# Patient Record
Sex: Male | Born: 1950 | Race: White | Hispanic: No | Marital: Married | Smoking: Former smoker
Health system: Southern US, Community
[De-identification: ages and names within clinical notes are randomized; demographics above are authoritative.]

## PROBLEM LIST (undated history)

## (undated) DIAGNOSIS — K219 Gastro-esophageal reflux disease without esophagitis: Secondary | ICD-10-CM

---

## 2007-10-06 ENCOUNTER — Emergency Department (HOSPITAL_COMMUNITY): Admission: EM | Admit: 2007-10-06 | Discharge: 2007-10-06 | Payer: Self-pay | Admitting: Emergency Medicine

## 2013-12-14 DIAGNOSIS — Z86718 Personal history of other venous thrombosis and embolism: Secondary | ICD-10-CM

## 2013-12-14 HISTORY — DX: Personal history of other venous thrombosis and embolism: Z86.718

## 2014-01-10 ENCOUNTER — Other Ambulatory Visit (HOSPITAL_COMMUNITY): Payer: Self-pay | Admitting: Physician Assistant

## 2014-01-10 ENCOUNTER — Ambulatory Visit (HOSPITAL_COMMUNITY)
Admission: RE | Admit: 2014-01-10 | Discharge: 2014-01-10 | Disposition: A | Discharge status: Home / Self Care | Payer: BC Managed Care – PPO | Source: Ambulatory Visit | Attending: Vascular Surgery | Admitting: Vascular Surgery

## 2014-01-10 DIAGNOSIS — M79661 Pain in right lower leg: Secondary | ICD-10-CM

## 2017-07-24 ENCOUNTER — Ambulatory Visit
Admission: RE | Admit: 2017-07-24 | Discharge: 2017-07-24 | Disposition: A | Discharge status: Home / Self Care | Payer: Self-pay | Source: Ambulatory Visit | Attending: Physician Assistant | Admitting: Physician Assistant

## 2017-07-24 ENCOUNTER — Other Ambulatory Visit: Payer: Self-pay | Admitting: Physician Assistant

## 2017-07-24 DIAGNOSIS — W19XXXA Unspecified fall, initial encounter: Secondary | ICD-10-CM

## 2017-08-29 ENCOUNTER — Emergency Department (HOSPITAL_COMMUNITY)
Admission: EM | Admit: 2017-08-29 | Discharge: 2017-08-30 | Disposition: A | Discharge status: Home / Self Care | Payer: BLUE CROSS/BLUE SHIELD | Attending: Emergency Medicine | Admitting: Emergency Medicine

## 2017-08-29 ENCOUNTER — Encounter (HOSPITAL_COMMUNITY): Payer: Self-pay | Admitting: Emergency Medicine

## 2017-08-29 DIAGNOSIS — Z86718 Personal history of other venous thrombosis and embolism: Secondary | ICD-10-CM | POA: Diagnosis not present

## 2017-08-29 DIAGNOSIS — R2241 Localized swelling, mass and lump, right lower limb: Secondary | ICD-10-CM | POA: Diagnosis not present

## 2017-08-29 DIAGNOSIS — M7989 Other specified soft tissue disorders: Secondary | ICD-10-CM

## 2017-08-29 LAB — CBC WITH DIFFERENTIAL/PLATELET
ABS IMMATURE GRANULOCYTES: 0 10*3/uL (ref 0.0–0.1)
BASOS ABS: 0.1 10*3/uL (ref 0.0–0.1)
BASOS PCT: 1 %
Eosinophils Absolute: 0.2 10*3/uL (ref 0.0–0.7)
Eosinophils Relative: 3 %
HCT: 41.8 % (ref 39.0–52.0)
HEMOGLOBIN: 14.2 g/dL (ref 13.0–17.0)
IMMATURE GRANULOCYTES: 0 %
LYMPHS PCT: 32 %
Lymphs Abs: 2.5 10*3/uL (ref 0.7–4.0)
MCH: 33.2 pg (ref 26.0–34.0)
MCHC: 34 g/dL (ref 30.0–36.0)
MCV: 97.7 fL (ref 78.0–100.0)
MONO ABS: 0.9 10*3/uL (ref 0.1–1.0)
Monocytes Relative: 12 %
NEUTROS ABS: 4 10*3/uL (ref 1.7–7.7)
NEUTROS PCT: 52 %
PLATELETS: 142 10*3/uL — AB (ref 150–400)
RBC: 4.28 MIL/uL (ref 4.22–5.81)
RDW: 13.6 % (ref 11.5–15.5)
WBC: 7.7 10*3/uL (ref 4.0–10.5)

## 2017-08-29 LAB — BASIC METABOLIC PANEL
Anion gap: 9 (ref 5–15)
BUN: 17 mg/dL (ref 8–23)
CHLORIDE: 110 mmol/L (ref 98–111)
CO2: 22 mmol/L (ref 22–32)
Calcium: 9 mg/dL (ref 8.9–10.3)
Creatinine, Ser: 1.5 mg/dL — ABNORMAL HIGH (ref 0.61–1.24)
GFR, EST AFRICAN AMERICAN: 54 mL/min — AB (ref >60)
GFR, EST NON AFRICAN AMERICAN: 47 mL/min — AB (ref >60)
Glucose, Bld: 102 mg/dL — ABNORMAL HIGH (ref 70–99)
POTASSIUM: 3.8 mmol/L (ref 3.5–5.1)
SODIUM: 141 mmol/L (ref 135–145)

## 2017-08-29 LAB — D-DIMER, QUANTITATIVE: D-Dimer, Quant: 1.25 ug/mL-FEU — ABNORMAL HIGH (ref 0.00–0.50)

## 2017-08-29 MED ORDER — RIVAROXABAN (XARELTO) VTE STARTER PACK (15 & 20 MG): ORAL_TABLET | ORAL | 0 refills | Status: DC

## 2017-08-29 MED ORDER — ENOXAPARIN SODIUM 120 MG/0.8ML ~~LOC~~ SOLN
115.0000 mg | Freq: Once | SUBCUTANEOUS | Status: AC
  Administered 2017-08-29: 115 mg via SUBCUTANEOUS
  Filled 2017-08-29: qty 0.77

## 2017-08-29 NOTE — Discharge Instructions (Signed)
You have been scheduled to have an ultrasound completed at 8:30 AM tomorrow.  Return for completion of this study.  In the interim, you have been covered with Lovenox which is a blood thinner.  If your ultrasound is positive for a DVT, start Xarelto as prescribed and schedule close follow-up with your primary care doctor.  You do not need to take Xarelto if your ultrasound is negative for a blood clot.  You may return to the emergency department for new or concerning symptoms.

## 2017-08-29 NOTE — ED Triage Notes (Signed)
Patient reports right calf pain with redness/swelling , pt. concerned about DVT due to long flight this week , respirations unlabored /no chest pain .

## 2017-08-29 NOTE — ED Provider Notes (Signed)
[place] EMERGENCY DEPARTMENT Provider Note   CSN: [id number] Arrival date & time: 08/29/17  2001     History   Chief Complaint Chief Complaint  Patient presents with  . ? DVT    Lower leg swelling    HPI Charles King is a 67 y.o. male.  67 year old male presents to the emergency department for evaluation of right lower extremity swelling and redness.  Symptoms have been present for the past 3 days.  Gradually worsening.  Notes some tenderness to his medial right calf at the site of redness.  Has noted some mild associated swelling in his right ankle.  Expresses concern for DVT due to long flight this week.  Has a history of DVT x3 as well as PE x1.  Is not chronically anticoagulated stating, "my doctor said the cure is worse than the disease"; usually has been on Xarelto with prior VTE events.  Denies any chest pain, shortness of breath, lightheadedness, syncope.  No recent fevers.  Was sent from urgent care for ultrasound completion.     History reviewed. No pertinent past medical history.  There are no active problems to display for this patient.   History reviewed. No pertinent surgical history.      Home Medications    Prior to Admission medications   Medication Sig Start Date End Date Taking? Authorizing Provider  Rivaroxaban 15 & 20 MG TBPK Take as directed on package: Start with one 15mg  tablet by mouth twice a day with food. On Day 22, switch to one 20mg  tablet once a day with food. 08/29/17   Antonietta Breach, PA-C    Family History No family history on file.  Social History Social History   Tobacco Use  . Smoking status: Never Smoker  . Smokeless tobacco: Never Used  Substance Use Topics  . Alcohol use: Yes  . Drug use: Never     Allergies   Patient has no known allergies.   Review of Systems Review of Systems Ten systems reviewed and are negative for acute change, except as noted in the HPI.    Physical Exam Updated Vital  Signs BP 124/90   Pulse (!) 50   Temp 98.7 F (37.1 C) (Oral)   Resp 16   Ht 6' (1.829 m)   Wt 115.7 kg   SpO2 100%   BMI 34.58 kg/m   Physical Exam  Constitutional: He is oriented to person, place, and time. He appears well-developed and well-nourished. No distress.  Nontoxic appearing and in no distress  HENT:  Head: Normocephalic and atraumatic.  Eyes: Conjunctivae and EOM are normal. No scleral icterus.  Neck: Normal range of motion.  Cardiovascular: Normal rate, regular rhythm and intact distal pulses.  DP pulse 2+ in the right lower extremity.  Pulmonary/Chest: Effort normal. No stridor. No respiratory distress.  Respirations even and unlabored  Musculoskeletal: Normal range of motion.  Area of erythema to the medial aspect of the right calf.  There is mild induration without lymphangitic streaking.  Associated tenderness to palpation.  No heat to touch.  Neurological: He is alert and oriented to person, place, and time. He exhibits normal muscle tone. Coordination normal.  Skin: Skin is warm and dry. No rash noted. He is not diaphoretic. No erythema. No pallor.  Psychiatric: He has a normal mood and affect. His behavior is normal.  Nursing note and vitals reviewed.    ED Treatments / Results  Labs (all labs ordered are listed, but only abnormal  results are displayed) Labs Reviewed  CBC WITH DIFFERENTIAL/PLATELET - Abnormal; Notable for the following components:      Result Value   Platelets 142 (*)    All other components within normal limits  BASIC METABOLIC PANEL - Abnormal; Notable for the following components:   Glucose, Bld 102 (*)    Creatinine, Ser 1.50 (*)    GFR calc non Af Amer 47 (*)    GFR calc Af Amer 54 (*)    All other components within normal limits  D-DIMER, QUANTITATIVE (NOT AT [facility]) - Abnormal; Notable for the following components:   D-Dimer, Quant 1.25 (*)    All other components within normal limits    EKG None  Radiology No results  found.  Procedures Procedures (including critical care time)  Medications Ordered in ED Medications  enoxaparin (LOVENOX) injection 115 mg (has no administration in time range)     Initial Impression / Assessment and Plan / ED Course  I have reviewed the triage vital signs and the nursing notes.  Pertinent labs & imaging results that were available during my care of the patient were reviewed by me and considered in my medical decision making (see chart for details).     67 year old male with prior history of VTE presents to the ED for 3 days of progressively worsening right lower extremity swelling.  Physical exam concerning for DVT.  He is neurovascularly intact with palpable DP pulse.  Unable to complete ultrasound given the hour.  He was given a dose of Lovenox prior to discharge with instruction to return in the morning for lower extremity ultrasound.  Prescription for Xarelto provided should ultrasound be positive for DVT.  Return precautions discussed and provided. Patient discharged in stable condition with no unaddressed concerns.   Final Clinical Impressions(s) / ED Diagnoses   Final diagnoses:  Swelling of right lower extremity  History of DVT (deep vein thrombosis)    ED Discharge Orders         Ordered    Rivaroxaban 15 & 20 MG TBPK     08/29/17 2318    LE VENOUS     08/29/17 2319           Antonietta Breach, PA-C 08/29/17 2340    Tegeler, Gwenyth Allegra, MD 08/30/17 [phone]

## 2017-08-30 ENCOUNTER — Ambulatory Visit (HOSPITAL_COMMUNITY)
Admission: RE | Admit: 2017-08-30 | Discharge: 2017-08-30 | Disposition: A | Discharge status: Home / Self Care | Payer: BLUE CROSS/BLUE SHIELD | Source: Ambulatory Visit | Attending: Emergency Medicine | Admitting: Emergency Medicine

## 2017-08-30 DIAGNOSIS — M7989 Other specified soft tissue disorders: Secondary | ICD-10-CM | POA: Insufficient documentation

## 2017-08-30 DIAGNOSIS — I82401 Acute embolism and thrombosis of unspecified deep veins of right lower extremity: Secondary | ICD-10-CM | POA: Diagnosis not present

## 2017-08-30 NOTE — ED Notes (Signed)
Patient verbalizes understanding of discharge instructions. Opportunity for questioning and answers were provided. Armband removed by staff, pt discharged from ED ambulatory.   

## 2017-08-30 NOTE — Progress Notes (Signed)
VASCULAR LAB PRELIMINARY  PRELIMINARY  PRELIMINARY  PRELIMINARY  Right lower extremity venous duplex completed.    Preliminary report:  There is no DVT noted in the right lower extremity.  There is superficial thrombosis noted in the right GSV in the mid calf at site of knotting.  Artyom Stencel, RVT 08/30/2017, 9:15 AM

## 2018-12-04 ENCOUNTER — Other Ambulatory Visit: Payer: Self-pay

## 2018-12-04 DIAGNOSIS — Z20822 Contact with and (suspected) exposure to covid-19: Secondary | ICD-10-CM

## 2018-12-07 LAB — NOVEL CORONAVIRUS, NAA: SARS-CoV-2, NAA: NOT DETECTED

## 2018-12-11 ENCOUNTER — Emergency Department (HOSPITAL_COMMUNITY): Payer: BC Managed Care – PPO

## 2018-12-11 ENCOUNTER — Emergency Department (HOSPITAL_COMMUNITY)
Admission: EM | Admit: 2018-12-11 | Discharge: 2018-12-11 | Disposition: A | Discharge status: Home / Self Care | Payer: BC Managed Care – PPO | Attending: Emergency Medicine | Admitting: Emergency Medicine

## 2018-12-11 ENCOUNTER — Encounter (HOSPITAL_COMMUNITY): Payer: Self-pay | Admitting: Emergency Medicine

## 2018-12-11 ENCOUNTER — Other Ambulatory Visit: Payer: Self-pay

## 2018-12-11 DIAGNOSIS — Z20828 Contact with and (suspected) exposure to other viral communicable diseases: Secondary | ICD-10-CM | POA: Diagnosis not present

## 2018-12-11 DIAGNOSIS — R058 Other specified cough: Secondary | ICD-10-CM

## 2018-12-11 DIAGNOSIS — R05 Cough: Secondary | ICD-10-CM | POA: Diagnosis present

## 2018-12-11 LAB — COMPREHENSIVE METABOLIC PANEL
ALT: 19 U/L (ref 0–44)
AST: 25 U/L (ref 15–41)
Albumin: 3.2 g/dL — ABNORMAL LOW (ref 3.5–5.0)
Alkaline Phosphatase: 45 U/L (ref 38–126)
Anion gap: 10 (ref 5–15)
BUN: 13 mg/dL (ref 8–23)
CO2: 23 mmol/L (ref 22–32)
Calcium: 8.2 mg/dL — ABNORMAL LOW (ref 8.9–10.3)
Chloride: 107 mmol/L (ref 98–111)
Creatinine, Ser: 1.14 mg/dL (ref 0.61–1.24)
GFR calc Af Amer: >60 mL/min (ref >60)
GFR calc non Af Amer: >60 mL/min (ref >60)
Glucose, Bld: 111 mg/dL — ABNORMAL HIGH (ref 70–99)
Potassium: 3.6 mmol/L (ref 3.5–5.1)
Sodium: 140 mmol/L (ref 135–145)
Total Bilirubin: 0.8 mg/dL (ref 0.3–1.2)
Total Protein: 6.4 g/dL — ABNORMAL LOW (ref 6.5–8.1)

## 2018-12-11 LAB — CBC WITH DIFFERENTIAL/PLATELET
Abs Immature Granulocytes: 0.03 10*3/uL (ref 0.00–0.07)
Basophils Absolute: 0 10*3/uL (ref 0.0–0.1)
Basophils Relative: 0 %
Eosinophils Absolute: 0 10*3/uL (ref 0.0–0.5)
Eosinophils Relative: 0 %
HCT: 41 % (ref 39.0–52.0)
Hemoglobin: 13.9 g/dL (ref 13.0–17.0)
Immature Granulocytes: 1 %
Lymphocytes Relative: 17 %
Lymphs Abs: 0.8 10*3/uL (ref 0.7–4.0)
MCH: 33.3 pg (ref 26.0–34.0)
MCHC: 33.9 g/dL (ref 30.0–36.0)
MCV: 98.1 fL (ref 80.0–100.0)
Monocytes Absolute: 0.6 10*3/uL (ref 0.1–1.0)
Monocytes Relative: 13 %
Neutro Abs: 3.5 10*3/uL (ref 1.7–7.7)
Neutrophils Relative %: 69 %
Platelets: 122 10*3/uL — ABNORMAL LOW (ref 150–400)
RBC: 4.18 MIL/uL — ABNORMAL LOW (ref 4.22–5.81)
RDW: 13.4 % (ref 11.5–15.5)
WBC: 5 10*3/uL (ref 4.0–10.5)
nRBC: 0 % (ref 0.0–0.2)

## 2018-12-11 LAB — SARS CORONAVIRUS 2 (TAT 6-24 HRS): SARS Coronavirus 2: NEGATIVE

## 2018-12-11 MED ORDER — DEXAMETHASONE SODIUM PHOSPHATE 10 MG/ML IJ SOLN
10.0000 mg | Freq: Once | INTRAMUSCULAR | Status: AC
  Administered 2018-12-11: 16:00:00 10 mg via INTRAVENOUS
  Filled 2018-12-11: qty 1

## 2018-12-11 MED ORDER — AZITHROMYCIN 250 MG PO TABS
500.0000 mg | ORAL_TABLET | Freq: Once | ORAL | Status: AC
  Administered 2018-12-11: 20:00:00 500 mg via ORAL
  Filled 2018-12-11: qty 2

## 2018-12-11 MED ORDER — AZITHROMYCIN 250 MG PO TABS: 250.0000 mg | ORAL_TABLET | Freq: Every day | ORAL | 0 refills | Status: DC

## 2018-12-11 MED ORDER — ACETAMINOPHEN 325 MG PO TABS
650.0000 mg | ORAL_TABLET | Freq: Once | ORAL | Status: AC
  Administered 2018-12-11: 650 mg via ORAL
  Filled 2018-12-11: qty 2

## 2018-12-11 MED ORDER — SODIUM CHLORIDE 0.9 % IV BOLUS
1000.0000 mL | Freq: Once | INTRAVENOUS | Status: AC
  Administered 2018-12-11: 1000 mL via INTRAVENOUS

## 2018-12-11 NOTE — ED Provider Notes (Signed)
[place] EMERGENCY DEPARTMENT Provider Note   CSN: [id number] Arrival date & time: 12/11/18  1423     History   Chief Complaint Chief Complaint  Patient presents with  . Covid Sx    HPI Charles King is a 68 y.o. male otherwise healthy presents to emergency department today with chief complaint of Covid-like symptoms x1 week.  He is endorsing generalized weakness, dry cough, shortness of breath and fever.  He states 3 coworkers tested positive for Covid last week.  He also had a test x 1 week ago that was negative.  States his symptoms really started 2 days after his testing. He took tylenol with transient symptom relief.  Reports subjective fever and chills at home however does not have a thermometer to measure his temperature.  He denies any congestion, sore throat, chest pain, abdominal pain, nausea, vomiting, urinary symptoms, diarrhea.  History provided by patient with additional history obtained from chart review.    History reviewed. No pertinent past medical history.  There are no active problems to display for this patient.   History reviewed. No pertinent surgical history.      Home Medications    Prior to Admission medications   Medication Sig Start Date End Date Taking? Authorizing Provider  azithromycin (ZITHROMAX) 250 MG tablet Take 1 tablet (250 mg total) by mouth daily for 4 days. Take one pill daily 12/11/18 12/15/18  Anapaola Kinsel, Harley Hallmark, PA-C  Rivaroxaban 15 & 20 MG TBPK Take as directed on package: Start with one 15mg  tablet by mouth twice a day with food. On Day 22, switch to one 20mg  tablet once a day with food. 08/29/17   Antonietta Breach, PA-C    Family History History reviewed. No pertinent family history.  Social History Social History   Tobacco Use  . Smoking status: Never Smoker  . Smokeless tobacco: Never Used  Substance Use Topics  . Alcohol use: Yes  . Drug use: Never     Allergies   Patient has no known allergies.    Review of Systems Review of Systems All other systems are reviewed and are negative for acute change except as noted in the HPI.   Physical Exam Updated Vital Signs BP 116/69   Pulse 75   Temp (!) 100.4 F (38 C) (Oral)   Resp 18   Ht 6' (1.829 m)   Wt 106.6 kg   SpO2 97%   BMI 31.87 kg/m   Physical Exam Vitals signs and nursing note reviewed.  Constitutional:      General: He is not in acute distress.    Appearance: He is not ill-appearing.  HENT:     Head: Normocephalic and atraumatic.     Right Ear: Tympanic membrane and external ear normal.     Left Ear: Tympanic membrane and external ear normal.     Nose: Nose normal.     Mouth/Throat:     Mouth: Mucous membranes are moist.     Pharynx: Oropharynx is clear.  Eyes:     General: No scleral icterus.       Right eye: No discharge.        Left eye: No discharge.     Extraocular Movements: Extraocular movements intact.     Conjunctiva/sclera: Conjunctivae normal.     Pupils: Pupils are equal, round, and reactive to light.  Neck:     Musculoskeletal: Normal range of motion.     Vascular: No JVD.  Cardiovascular:  Rate and Rhythm: Normal rate and regular rhythm.     Pulses: Normal pulses.          Radial pulses are 2+ on the right side and 2+ on the left side.     Heart sounds: Normal heart sounds.  Pulmonary:     Comments: Lungs clear to auscultation in all fields. Symmetric chest rise. No wheezing, rales, or rhonchi. Abdominal:     Comments: Abdomen is soft, non-distended, and non-tender in all quadrants. No rigidity, no guarding. No peritoneal signs.  Musculoskeletal: Normal range of motion.  Skin:    General: Skin is warm and dry.     Capillary Refill: Capillary refill takes less than 2 seconds.  Neurological:     Mental Status: He is oriented to person, place, and time.     GCS: GCS eye subscore is 4. GCS verbal subscore is 5. GCS motor subscore is 6.     Comments: Fluent speech, no facial droop.   Psychiatric:        Behavior: Behavior normal.      ED Treatments / Results  Labs (all labs ordered are listed, but only abnormal results are displayed) Labs Reviewed  COMPREHENSIVE METABOLIC PANEL - Abnormal; Notable for the following components:      Result Value   Glucose, Bld 111 (*)    Calcium 8.2 (*)    Total Protein 6.4 (*)    Albumin 3.2 (*)    All other components within normal limits  CBC WITH DIFFERENTIAL/PLATELET - Abnormal; Notable for the following components:   RBC 4.18 (*)    Platelets 122 (*)    All other components within normal limits  SARS CORONAVIRUS 2 (TAT 6-24 HRS)    EKG None  Radiology Dg Chest Portable 1 View  Result Date: 12/11/2018 CLINICAL DATA:  Generalized weakness, dry cough, shortness of breath, fever, worse with 3 people who tested positive for COVID last week but his test this past Monday was negative EXAM: PORTABLE CHEST 1 VIEW COMPARISON:  Portable exam 1539 hours without priors for comparison FINDINGS: Upper normal heart size. Mediastinal contours and pulmonary vascularity normal. Question mild patchy infiltrate LEFT mid lung. Remaining lungs clear. No pleural effusion or pneumothorax. No acute osseous findings. IMPRESSION: Question mild patchy infiltrate at LEFT mid lung. Electronically Signed   By: Lavonia Dana M.D.   On: 12/11/2018 16:05    Procedures Procedures (including critical care time)  Medications Ordered in ED Medications  azithromycin (ZITHROMAX) tablet 500 mg (has no administration in time range)  sodium chloride 0.9 % bolus 1,000 mL (0 mLs Intravenous Stopped 12/11/18 1754)  acetaminophen (TYLENOL) tablet 650 mg (650 mg Oral Given 12/11/18 1604)  dexamethasone (DECADRON) injection 10 mg (10 mg Intravenous Given 12/11/18 1605)     Initial Impression / Assessment and Plan / ED Course  I have reviewed the triage vital signs and the nursing notes.  Pertinent labs & imaging results that were available during my care of  the patient were reviewed by me and considered in my medical decision making (see chart for details).  Patient seen and examined. Patient nontoxic appearing, in no apparent distress.  Febrile to 100.4.  No hypoxia or tachycardia.He is coughing through entirety of exam.  Lungs are clear to auscultation in all fields.  No wheezing rhonchi or rales heard.  Abdomen is nontender, no peritoneal signs.  Chest xray viewed by me shows patchy infiltrate in left midlung. CBC with no leukocytosis, no anemia.  Does have  thrombocytopenia with platelet count of 122.  Based on chart review he has history of the same. IVF, Tylenol, Decadron given. Patient ambulated without respiratory distress, SpO2 ranged from 93-95%, did drop to 91% briefly but immediately improved to 93% on room air. Covid swab pending.  Patient aware he needs to self quarantine and self isolate until he has results.  On reassessment pt is afebrile at 98.84F oral.  Discussed symptomatic treatment.  Engaged in shared decision making, patient feels that he can manage his symptoms at home.  Will prescribe a Z-Pak as his chest x-ray showed patchy infiltrate to suggest pneumonia versus possible Covid. The patient appears reasonably screened and/or stabilized for discharge and I doubt any other medical condition or other [facility] requiring further screening, evaluation, or treatment in the ED at this time prior to discharge. The patient is safe for discharge with strict return precautions discussed. Recommend pcp follow up if symptoms persist. Findings and plan of care discussed with supervising physician Dr. Royann Shivers.   Portions of this note were generated with [employer]. Dictation errors may occur despite best attempts at proofreading.    Final Clinical Impressions(s) / ED Diagnoses   Final diagnoses:  Cough with exposure to COVID-19 virus    ED Discharge Orders         Ordered    azithromycin (ZITHROMAX) 250 MG tablet  Daily      12/11/18 1912           Flint Melter 12/11/18 1916    Lucrezia Starch, MD 12/12/18 [phone]

## 2018-12-11 NOTE — ED Triage Notes (Signed)
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 Pt got tested, came back negative on Monday. Pt endorses generalized weakness, dry cough, sob, fever. Denies GI sx. Took tylenol at home with no improvement.

## 2018-12-11 NOTE — Discharge Instructions (Addendum)
Thank you for allowing [place] to care for you today.   Please return to the emergency department if you have any new or worsening symptoms.  Your Covid test today is still pending.  The results to be available in 24 hours.  Please continue to self quarantine until you have the result.  You will be called if the test is positive.  If it is negative it will result and be visible for you to see in your MyChart.  -Prescription sent to your pharmacy for azithromycin.  Your chest x-ray showed pneumonia which very well could be Covid.  Just in case we will cover you with antibiotic.  You were given your first dose today in the emergency department.  Please start taking tomorrow and take as directed.  Medications- You can take medications to help treat your symptoms: -Tylenol for fever and body aches. Please take as prescribed on the bottle. -Over the coutner cough medicine such as mucinex, robitussin, or other brands.  Treatment- This is a virus and unfortunately there are no antibitotics approved to treat this virus at this time. It is important to monitor your symptoms closely: -You should have a theremometer at home to check your temperature when feeling feverish. -Use a pulse ox meter to measure your oxygen when feeling short of breath.  -If your fever is over 100.4 despite taking tylenol or if your oxygen level drops below 94% these are reasons to rturn to the emergency department for further evaluation. Please call the emergency department before you come to make [place] aware.    We recommend you self-isolate for 10 days and to inform your work/family/friends that you has the virus.  They will need to self-quarantine for 14 days to monitor for symptoms.    Again: symptoms of shortnessf breath, chest pain, difficulty breathing, new onset of confuison, any symptoms that are concerning. And you or the person should come to emergency department for evaluation.   I hope you feel better soon

## 2018-12-11 NOTE — ED Notes (Signed)
Pt ambulated around the room. Oxygen saturation ranged from 93% - 95%. It did drop to 91% briefly but went back up to 93% immediately.

## 2018-12-13 ENCOUNTER — Other Ambulatory Visit: Payer: Self-pay

## 2018-12-13 ENCOUNTER — Encounter (HOSPITAL_COMMUNITY): Payer: Self-pay | Admitting: Emergency Medicine

## 2018-12-13 ENCOUNTER — Inpatient Hospital Stay (HOSPITAL_COMMUNITY)
Admission: EM | Admit: 2018-12-13 | Discharge: 2018-12-20 | DRG: 177 | Disposition: A | Discharge status: Home / Self Care | Payer: BC Managed Care – PPO | Attending: Internal Medicine | Admitting: Internal Medicine

## 2018-12-13 ENCOUNTER — Emergency Department (HOSPITAL_COMMUNITY): Payer: BC Managed Care – PPO

## 2018-12-13 DIAGNOSIS — Z23 Encounter for immunization: Secondary | ICD-10-CM

## 2018-12-13 DIAGNOSIS — J9601 Acute respiratory failure with hypoxia: Secondary | ICD-10-CM | POA: Insufficient documentation

## 2018-12-13 DIAGNOSIS — Z87891 Personal history of nicotine dependence: Secondary | ICD-10-CM | POA: Diagnosis not present

## 2018-12-13 DIAGNOSIS — E876 Hypokalemia: Secondary | ICD-10-CM | POA: Diagnosis present

## 2018-12-13 DIAGNOSIS — K219 Gastro-esophageal reflux disease without esophagitis: Secondary | ICD-10-CM | POA: Diagnosis present

## 2018-12-13 DIAGNOSIS — Z86718 Personal history of other venous thrombosis and embolism: Secondary | ICD-10-CM

## 2018-12-13 DIAGNOSIS — J1289 Other viral pneumonia: Secondary | ICD-10-CM | POA: Diagnosis present

## 2018-12-13 DIAGNOSIS — U071 COVID-19: Secondary | ICD-10-CM | POA: Diagnosis not present

## 2018-12-13 HISTORY — DX: Gastro-esophageal reflux disease without esophagitis: K21.9

## 2018-12-13 LAB — COMPREHENSIVE METABOLIC PANEL
ALT: 30 U/L (ref 0–44)
AST: 37 U/L (ref 15–41)
Albumin: 3 g/dL — ABNORMAL LOW (ref 3.5–5.0)
Alkaline Phosphatase: 54 U/L (ref 38–126)
Anion gap: 12 (ref 5–15)
BUN: 18 mg/dL (ref 8–23)
CO2: 22 mmol/L (ref 22–32)
Calcium: 8.5 mg/dL — ABNORMAL LOW (ref 8.9–10.3)
Chloride: 106 mmol/L (ref 98–111)
Creatinine, Ser: 1.16 mg/dL (ref 0.61–1.24)
GFR calc Af Amer: >60 mL/min (ref >60)
GFR calc non Af Amer: >60 mL/min (ref >60)
Glucose, Bld: 111 mg/dL — ABNORMAL HIGH (ref 70–99)
Potassium: 3.1 mmol/L — ABNORMAL LOW (ref 3.5–5.1)
Sodium: 140 mmol/L (ref 135–145)
Total Bilirubin: 1 mg/dL (ref 0.3–1.2)
Total Protein: 6.7 g/dL (ref 6.5–8.1)

## 2018-12-13 LAB — CBC WITH DIFFERENTIAL/PLATELET
Abs Immature Granulocytes: 0.07 10*3/uL (ref 0.00–0.07)
Basophils Absolute: 0 10*3/uL (ref 0.0–0.1)
Basophils Relative: 0 %
Eosinophils Absolute: 0 10*3/uL (ref 0.0–0.5)
Eosinophils Relative: 0 %
HCT: 41 % (ref 39.0–52.0)
Hemoglobin: 13.9 g/dL (ref 13.0–17.0)
Immature Granulocytes: 1 %
Lymphocytes Relative: 8 %
Lymphs Abs: 0.9 10*3/uL (ref 0.7–4.0)
MCH: 33.7 pg (ref 26.0–34.0)
MCHC: 33.9 g/dL (ref 30.0–36.0)
MCV: 99.3 fL (ref 80.0–100.0)
Monocytes Absolute: 0.5 10*3/uL (ref 0.1–1.0)
Monocytes Relative: 4 %
Neutro Abs: 10.1 10*3/uL — ABNORMAL HIGH (ref 1.7–7.7)
Neutrophils Relative %: 87 %
Platelets: 180 10*3/uL (ref 150–400)
RBC: 4.13 MIL/uL — ABNORMAL LOW (ref 4.22–5.81)
RDW: 13.7 % (ref 11.5–15.5)
WBC: 11.6 10*3/uL — ABNORMAL HIGH (ref 4.0–10.5)
nRBC: 0 % (ref 0.0–0.2)

## 2018-12-13 LAB — TRIGLYCERIDES: Triglycerides: 99 mg/dL (ref <150)

## 2018-12-13 LAB — PROTIME-INR
INR: 1 (ref 0.8–1.2)
Prothrombin Time: 13.5 seconds (ref 11.4–15.2)

## 2018-12-13 LAB — MAGNESIUM: Magnesium: 1.9 mg/dL (ref 1.7–2.4)

## 2018-12-13 LAB — LACTIC ACID, PLASMA
Lactic Acid, Venous: 1.8 mmol/L (ref 0.5–1.9)
Lactic Acid, Venous: 1.7 mmol/L (ref 0.5–1.9)

## 2018-12-13 LAB — FERRITIN: Ferritin: 393 ng/mL — ABNORMAL HIGH (ref 24–336)

## 2018-12-13 LAB — POC SARS CORONAVIRUS 2 AG -  ED: SARS Coronavirus 2 Ag: POSITIVE — AB

## 2018-12-13 LAB — LACTATE DEHYDROGENASE: LDH: 295 U/L — ABNORMAL HIGH (ref 98–192)

## 2018-12-13 LAB — FIBRINOGEN: Fibrinogen: >800 mg/dL — ABNORMAL HIGH (ref 210–475)

## 2018-12-13 LAB — C-REACTIVE PROTEIN: CRP: 20.6 mg/dL — ABNORMAL HIGH (ref <1.0)

## 2018-12-13 LAB — APTT: aPTT: 32 seconds (ref 24–36)

## 2018-12-13 LAB — PROCALCITONIN: Procalcitonin: 0.43 ng/mL

## 2018-12-13 LAB — D-DIMER, QUANTITATIVE: D-Dimer, Quant: 2.32 ug/mL-FEU — ABNORMAL HIGH (ref 0.00–0.50)

## 2018-12-13 MED ORDER — ZINC SULFATE 220 (50 ZN) MG PO CAPS
220.0000 mg | ORAL_CAPSULE | Freq: Every day | ORAL | Status: DC
  Administered 2018-12-13 – 2018-12-20 (×8): 220 mg via ORAL
  Filled 2018-12-13 (×9): qty 1

## 2018-12-13 MED ORDER — PANTOPRAZOLE SODIUM 40 MG PO TBEC
40.0000 mg | DELAYED_RELEASE_TABLET | Freq: Every day | ORAL | Status: DC
  Administered 2018-12-14 – 2018-12-20 (×7): 40 mg via ORAL
  Filled 2018-12-13 (×8): qty 1

## 2018-12-13 MED ORDER — SODIUM CHLORIDE 0.9 % IV SOLN: 100.0000 mg | INTRAVENOUS | Status: DC

## 2018-12-13 MED ORDER — ALBUTEROL SULFATE HFA 108 (90 BASE) MCG/ACT IN AERS: 4.0000 | INHALATION_SPRAY | Freq: Once | RESPIRATORY_TRACT | Status: DC

## 2018-12-13 MED ORDER — METOCLOPRAMIDE HCL 5 MG/ML IJ SOLN
10.0000 mg | Freq: Once | INTRAMUSCULAR | Status: AC
  Administered 2018-12-13: 10 mg via INTRAVENOUS
  Filled 2018-12-13: qty 2

## 2018-12-13 MED ORDER — SODIUM CHLORIDE 0.9 % IV SOLN
1.0000 g | Freq: Once | INTRAVENOUS | Status: AC
  Administered 2018-12-13: 1 g via INTRAVENOUS
  Filled 2018-12-13: qty 10

## 2018-12-13 MED ORDER — HYDROCOD POLST-CPM POLST ER 10-8 MG/5ML PO SUER
5.0000 mL | Freq: Two times a day (BID) | ORAL | Status: DC | PRN
  Administered 2018-12-15: 5 mL via ORAL
  Filled 2018-12-13: qty 5

## 2018-12-13 MED ORDER — SODIUM CHLORIDE 0.9 % IV SOLN
200.0000 mg | Freq: Once | INTRAVENOUS | Status: AC
  Administered 2018-12-13: 200 mg via INTRAVENOUS
  Filled 2018-12-13 (×2): qty 40

## 2018-12-13 MED ORDER — SODIUM CHLORIDE 0.9% FLUSH: 3.0000 mL | Freq: Once | INTRAVENOUS | Status: DC

## 2018-12-13 MED ORDER — ACETAMINOPHEN 500 MG PO TABS
1000.0000 mg | ORAL_TABLET | Freq: Once | ORAL | Status: AC
  Administered 2018-12-13: 1000 mg via ORAL
  Filled 2018-12-13: qty 2

## 2018-12-13 MED ORDER — ACETAMINOPHEN 325 MG PO TABS
650.0000 mg | ORAL_TABLET | Freq: Four times a day (QID) | ORAL | Status: DC | PRN
  Administered 2018-12-13 – 2018-12-16 (×3): 650 mg via ORAL
  Filled 2018-12-13 (×3): qty 2

## 2018-12-13 MED ORDER — GUAIFENESIN-DM 100-10 MG/5ML PO SYRP
10.0000 mL | ORAL_SOLUTION | ORAL | Status: DC | PRN
  Administered 2018-12-13: 10 mL via ORAL
  Filled 2018-12-13: qty 10

## 2018-12-13 MED ORDER — VITAMIN C 500 MG PO TABS
500.0000 mg | ORAL_TABLET | Freq: Every day | ORAL | Status: DC
  Administered 2018-12-13 – 2018-12-20 (×8): 500 mg via ORAL
  Filled 2018-12-13 (×8): qty 1

## 2018-12-13 MED ORDER — SODIUM CHLORIDE 0.9 % IV BOLUS
500.0000 mL | Freq: Once | INTRAVENOUS | Status: AC
  Administered 2018-12-13: 500 mL via INTRAVENOUS

## 2018-12-13 MED ORDER — METHYLPREDNISOLONE SODIUM SUCC 125 MG IJ SOLR
0.5000 mg/kg | Freq: Two times a day (BID) | INTRAMUSCULAR | Status: DC
  Administered 2018-12-13 – 2018-12-14 (×2): 57.5 mg via INTRAVENOUS
  Filled 2018-12-13 (×2): qty 2

## 2018-12-13 MED ORDER — ONDANSETRON HCL 4 MG PO TABS: 4.0000 mg | ORAL_TABLET | Freq: Four times a day (QID) | ORAL | Status: DC | PRN

## 2018-12-13 MED ORDER — POTASSIUM CHLORIDE CRYS ER 20 MEQ PO TBCR
40.0000 meq | EXTENDED_RELEASE_TABLET | Freq: Once | ORAL | Status: AC
  Administered 2018-12-13: 40 meq via ORAL
  Filled 2018-12-13: qty 2

## 2018-12-13 MED ORDER — ALBUTEROL SULFATE HFA 108 (90 BASE) MCG/ACT IN AERS
2.0000 | INHALATION_SPRAY | Freq: Four times a day (QID) | RESPIRATORY_TRACT | Status: DC
  Administered 2018-12-13 – 2018-12-20 (×23): 2 via RESPIRATORY_TRACT
  Filled 2018-12-13 (×3): qty 6.7

## 2018-12-13 MED ORDER — ENOXAPARIN SODIUM 40 MG/0.4ML ~~LOC~~ SOLN
40.0000 mg | SUBCUTANEOUS | Status: DC
  Administered 2018-12-13: 40 mg via SUBCUTANEOUS
  Filled 2018-12-13: qty 0.4

## 2018-12-13 MED ORDER — ONDANSETRON HCL 4 MG/2ML IJ SOLN: 4.0000 mg | Freq: Four times a day (QID) | INTRAMUSCULAR | Status: DC | PRN

## 2018-12-13 MED ORDER — METHYLPREDNISOLONE SODIUM SUCC 125 MG IJ SOLR: 125.0000 mg | Freq: Once | INTRAMUSCULAR | Status: DC

## 2018-12-13 MED ORDER — AZITHROMYCIN 250 MG PO TABS: 500.0000 mg | ORAL_TABLET | Freq: Once | ORAL | Status: DC

## 2018-12-13 NOTE — H&P (Signed)
History and Physical    Rio Kidane WCH:[id number] DOB: June 17, 1950 DOA: 12/13/2018  PCP: Alroy Dust, L.Marlou Sa, MD  Patient coming from: Home  I have personally briefly reviewed patient's old medical records in [place]  Chief Complaint: Shortness of breath, cough, fevers  HPI: Charles King is a 68 y.o. male with medical history significant for GERD and history of DVT s/p anticoagulation treatment with Xarelto who presents to the ED for evaluation of persistent and progressive shortness of breath, nonproductive cough, fevers, diaphoresis, myalgias, and generalized weakness.  Patient states symptoms began about 1 week ago.  He was seen in the ED on 12/11/2018 at which time he had a negative SARS-CoV-2, NAA test.  Follow-up chest x-ray showed a questionable mild patchy infiltrate at the left midlung.  He was treated with IV Decadron 10 mg in the ED and discharged with a course of oral azithromycin.  Patient returns to the ED for evaluation of persistent and worsening symptoms.  He denies any associated nausea, vomiting, chest pain, abdominal pain, diarrhea, dysuria, peripheral edema.  The only medication he is regularly taking his Nexium for acid reflux.  He is a former smoker of 2 packs/day for 15 years, reports quitting about 35 years ago.  He reports drinking 1 beer occasionally.  He reports occasional marijuana use, denies any IV drug or cocaine use.  He says one of his brothers died of a heart attack 3 years ago.  He says another one of his brothers had a partial lung resection due to cancer.  ED Course:  Initial vitals showed BP 99/63, pulse 94, RR 14, temp 100.0 Fahrenheit, SPO2 93% on 2 L supplemental O2 via .  Per ED documentation SPO2 drops to 80% on room air with slight ambulation.  Labs are notable for WBC 11.6, hemoglobin 13.9, platelets 180,000, potassium 3.1, sodium 140, bicarb 22, BUN 18, creatinine 1.16, AST 37, ALT 30, alk phos 54, total bilirubin 1.0.  Lactic acid 1.8.   Blood cultures were obtained and pending.  SARS-CoV-2 antigen test is positive.  CRP is 20.6, ferritin 293, LDH 295, procalcitonin 0.43, triglycerides 99.  Portable chest x-ray showed bilateral peripheral and subpleural hazy densities, left greater than right with overall progression of airspace densities compared to prior on 12/11/2018.  Patient was given IV ceftriaxone, 500 mL normal saline, oral potassium 40 mEq, and Tylenol.  The hospitalist service was consulted admit for further evaluation and management.  Review of Systems: All systems reviewed and are negative except as documented in history of present illness above.   Past Medical History:  Diagnosis Date  . GERD (gastroesophageal reflux disease)   . History of DVT (deep vein thrombosis) 12/2013    History reviewed. No pertinent surgical history.  Social History:  reports that he has quit smoking. His smoking use included cigarettes. He has a 30.00 pack-year smoking history. He has never used smokeless tobacco. He reports current alcohol use. He reports previous drug use.  No Known Allergies  Family History  Problem Relation Age of Onset  . Heart attack Brother      Prior to Admission medications   Medication Sig Start Date End Date Taking? Authorizing Provider  acetaminophen (TYLENOL) 325 MG tablet Take 650 mg by mouth every 6 (six) hours as needed.   Yes [provider]  esomeprazole (NEXIUM) 20 MG capsule Take 20 mg by mouth daily at 12 noon.   Yes [provider]  Rivaroxaban 15 & 20 MG TBPK Take as directed on  package: Start with one 62m tablet by mouth twice a day with food. On Day 22, switch to one 229mtablet once a day with food. Patient not taking: Reported on 12/13/2018 08/29/17   HuAntonietta BreachPA-C    Physical Exam: Vitals:   12/13/18 1845 12/13/18 1900 12/13/18 1915 12/13/18 1930  BP: 121/70 106/66  113/71  Pulse: 80 75 77 75  Resp: (!) 25 (!) 26 (!) 23 (!) 23  Temp:      TempSrc:       SpO2: 94% 95% 97% 98%  Weight:      Height:        Constitutional: Resting supine in bed, NAD, calm, comfortable Eyes: PERRL, lids and conjunctivae normal ENMT: Mucous membranes are moist. Posterior pharynx clear of any exudate or lesions.Normal dentition.  Neck: normal, supple, no masses. Respiratory: Faint inspiratory crackles bilateral lung fields.  Normal respiratory effort. No accessory muscle use.  Cardiovascular: Regular rate and rhythm, no murmurs / rubs / gallops. No extremity edema. 2+ pedal pulses. Abdomen: no tenderness, no masses palpated. No hepatosplenomegaly. Bowel sounds positive.  Musculoskeletal: no clubbing / cyanosis. No joint deformity upper and lower extremities. Good ROM, no contractures. Normal muscle tone.  Skin: no rashes, lesions, ulcers. No induration Neurologic: CN 2-12 grossly intact. Sensation intact, Strength 5/5 in all 4.  Psychiatric: Normal judgment and insight. Alert and oriented x 3. Normal mood.     Labs on Admission: I have personally reviewed following labs and imaging studies  CBC: Recent Labs  Lab 12/11/18 1606 12/13/18 1433  WBC 5.0 11.6*  NEUTROABS 3.5 10.1*  HGB 13.9 13.9  HCT 41.0 41.0  MCV 98.1 99.3  PLT 122* 18825 Basic Metabolic Panel: Recent Labs  Lab 12/11/18 1606 12/13/18 1433 12/13/18 1733  NA 140 140  --   K 3.6 3.1*  --   CL 107 106  --   CO2 23 22  --   GLUCOSE 111* 111*  --   BUN 13 18  --   CREATININE 1.14 1.16  --   CALCIUM 8.2* 8.5*  --   MG  --   --  1.9   GFR: Estimated Creatinine Clearance: 80.8 mL/min (by C-G formula based on SCr of 1.16 mg/dL). Liver Function Tests: Recent Labs  Lab 12/11/18 1606 12/13/18 1433  AST 25 37  ALT 19 30  ALKPHOS 45 54  BILITOT 0.8 1.0  PROT 6.4* 6.7  ALBUMIN 3.2* 3.0*   No results for input(s): LIPASE, AMYLASE in the last 168 hours. No results for input(s): AMMONIA in the last 168 hours. Coagulation Profile: Recent Labs  Lab 12/13/18 1753  INR 1.0    Cardiac Enzymes: No results for input(s): CKTOTAL, CKMB, CKMBINDEX, TROPONINI in the last 168 hours. BNP (last 3 results) No results for input(s): PROBNP in the last 8760 hours. HbA1C: No results for input(s): HGBA1C in the last 72 hours. CBG: No results for input(s): GLUCAP in the last 168 hours. Lipid Profile: Recent Labs    12/13/18 1753  TRIG 99   Thyroid Function Tests: No results for input(s): TSH, T4TOTAL, FREET4, T3FREE, THYROIDAB in the last 72 hours. Anemia Panel: Recent Labs    12/13/18 1753  FERRITIN 393*   Urine analysis: No results found for: COLORURINE, APPEARANCEUR, LABSPEC, PHURINE, GLUCOSEU, HGBUR, BILIRUBINUR, KETONESUR, PROTEINUR, UROBILINOGEN, NITRITE, LEUKOCYTESUR  Radiological Exams on Admission: Dg Chest Portable 1 View  Result Date: 12/13/2018 CLINICAL DATA:  [age]ear old male with generalized weakness and dry cough. COVID  19 exposure. EXAM: PORTABLE CHEST 1 VIEW COMPARISON:  Chest radiograph dated 12/11/2018. FINDINGS: There is shallow inspiration. Bilateral predominantly peripheral and subpleural hazy densities, left greater right most consistent with developing infiltrate, likely atypical or viral in etiology. Overall progression of airspace densities compared to the prior radiograph. There is no pleural effusion or pneumothorax. Stable cardiac silhouette. No acute osseous pathology. IMPRESSION: Bilateral peripheral and subpleural hazy densities most consistent with developing infiltrate, likely atypical or viral in etiology. Follow-up recommended. Electronically Signed   By: Anner Crete M.D.   On: 12/13/2018 15:08    EKG: Independently reviewed. Normal sinus rhythm, LAD, low voltage, motion artifact.  No prior for comparison.  Assessment/Plan Principal Problem:   Acute hypoxemic respiratory failure due to COVID-19 [facility]) Active Problems:   Hypokalemia   GERD (gastroesophageal reflux disease)  Blade Scheff is a 67 y.o. male with medical  history significant for GERD and history of DVT s/p anticoagulation treatment with Xarelto who is admitted with acute respiratory failure with hypoxia due to COVID-19 viral pneumonia.  Acute respiratory failure with hypoxia due to COVID-19 viral pneumonia: -SARS-CoV-2 antigen positive test on 12/13/2018.   -He becomes hypoxic to low 80s SPO2 on room air with minimal ambulation, improves to mid to high 90s on 2 L supplemental O2 via [place].  -Chest x-ray shows bilateral peripheral subpleural densities, progressed from prior x-ray 2 days ago.  -Initial inflammatory markers show CRP 20.6, ferritin 293, LDH 295 -Start IV Solu-Medrol 0.5 mg/kg every 12 hours -Start IV remdesivir per pharmacy protocol -Continue supplemental oxygen and wean off as able -Continue albuterol inhaler, flutter valve, incentive spirometer -Continue antitussives, vitamin C, zinc  Hypokalemia: Repleted orally.  Magnesium is 1.9.  Continue to monitor.  GERD: Continue PPI.  DVT prophylaxis: Lovenox Code Status: Full code, confirmed with patient Family Communication: Discussed with patient, he has discussed with family Disposition Plan: Admit to [facility] called: None Admission status: Inpatient for management of acute respiratory failure with hypoxia due to COVID-19 viral pneumonia, anticipate at least 5-day inpatient stay for treatment with IV steroids and IV remdesivir.   Zada Finders MD [facility]  If 7PM-7AM, please contact night-coverage [email or web address]  12/13/2018, 7:56 PM

## 2018-12-13 NOTE — ED Notes (Signed)
Taking tylenol every 4 hours, last dose at 1130.  Continues to have non-productive cough, on abx currently.  Seen here 2 days ago.

## 2018-12-13 NOTE — ED Notes (Signed)
POC CoV-2 COVID test Positive, called to [facility]

## 2018-12-13 NOTE — ED Notes (Signed)
Report has been called to [facility]. Orlie Pollen, RN received report.

## 2018-12-13 NOTE — ED Provider Notes (Signed)
[facility] EMERGENCY DEPARTMENT Provider Note   CSN: [id number] Arrival date & time: 12/13/18  1349     History   Chief Complaint Chief Complaint  Patient presents with   Shortness of Breath    HPI Charles King is a 68 y.o. male sees PCP with [facility]. No medical hx besides GERD      HPI  Presents for persistent shortness of breath, dry cough, body aches, fever. Patient states he started having symptoms 1 week ago with become persistently worse.  Was seen in ED 2 days ago with possible infiltrate on chest x-ray treated with azithromycin will dose of 10 mg of Decadron.  Patient states that he went home and continue take Tylenol 500 mg every 4 hours which did not control his fevers or body aches.  States that T-max was 103.  States he came in today because he was feeling increased shortness of breath worse with exertion with no chest pain.  Does endorse chest pressure.  Also endorses headache that is bandlike, came on gradually, without neurologic symptoms as weakness, vision changes, numbness.   States he occasionally has periods of sweatiness especially at night.  States he has had to prop himself on 2 pillows at night.  Denies any swelling in his legs.  Denies any abdominal swelling.  States his appetite is decreased but denies any changes in taste or smell.  Denies any nausea, vomiting, diarrhea, states last bowel movement was yesterday was normal.  Denies any congestion.  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  States that he was on approximately past he states he last took it approximately 8 years ago when he had his last DVT.  States his primary care doctor took him off anticoagulation due to concerns of side effects of the medication.  States that his symptoms today feel nothing like his prior pulmonary embolism.  States his last dose of Tylenol was earlier this morning approximately 8 AM.  States that he took his azithromycin this  morning as well.  History reviewed. No pertinent past medical history.  There are no active problems to display for this patient.   History reviewed. No pertinent surgical history.      Home Medications    Prior to Admission medications   Medication Sig Start Date End Date Taking? Authorizing Provider  acetaminophen (TYLENOL) 325 MG tablet Take 650 mg by mouth every 6 (six) hours as needed.   Yes [provider]  esomeprazole (NEXIUM) 20 MG capsule Take 20 mg by mouth daily at 12 noon.   Yes [provider]  Rivaroxaban 15 & 20 MG TBPK Take as directed on package: Start with one 15mg  tablet by mouth twice a day with food. On Day 22, switch to one 20mg  tablet once a day with food. Patient not taking: Reported on 12/13/2018 08/29/17   Antonietta Breach, PA-C    Family History No family history on file.  Social History Social History   Tobacco Use   Smoking status: Never Smoker   Smokeless tobacco: Never Used  Substance Use Topics   Alcohol use: Yes   Drug use: Never     Allergies   Patient has no known allergies.   Review of Systems Review of Systems  Constitutional: Positive for activity change, appetite change, chills, fatigue and fever.  HENT: Negative for congestion.   Respiratory: Positive for cough and shortness of breath. Negative for choking and chest tightness.   Cardiovascular: Negative  for chest pain, palpitations and leg swelling.  Gastrointestinal: Negative for abdominal distention, abdominal pain, constipation, diarrhea, nausea and vomiting.  Neurological: Negative for dizziness.  All other systems reviewed and are negative.    Physical Exam Updated Vital Signs BP 134/81    Pulse 82    Temp 99.7 F (37.6 C) (Oral)    Resp (!) 22    Ht 6' (1.829 m)    Wt 114.8 kg    SpO2 96%    BMI 34.31 kg/m   Physical Exam Vitals signs and nursing note reviewed.  Constitutional:      General: He is not in acute distress. HENT:     Head:  Normocephalic and atraumatic.     Nose: Nose normal.     Mouth/Throat:     Mouth: Mucous membranes are moist.  Eyes:     General: No scleral icterus. Neck:     Musculoskeletal: Normal range of motion.  Cardiovascular:     Rate and Rhythm: Normal rate and regular rhythm.     Pulses: Normal pulses.     Heart sounds: Normal heart sounds.  Pulmonary:     Effort: Pulmonary effort is normal.     Comments: Crackles diffusely worst in right lower lung base.  Able speak in full sentences.  No accessory muscle usage. Abdominal:     Palpations: Abdomen is soft.     Tenderness: There is no abdominal tenderness.     Comments: Abdomen nontender, nondistended, no fluid wave,  Musculoskeletal:     Right lower leg: No edema.     Left lower leg: No edema.     Comments: No swelling or tenderness bilateral lower extremities.  Skin:    General: Skin is warm and dry.     Capillary Refill: Capillary refill takes less than 2 seconds.  Neurological:     Mental Status: He is alert. Mental status is at baseline.  Psychiatric:        Mood and Affect: Mood normal.        Behavior: Behavior normal.      ED Treatments / Results  Labs (all labs ordered are listed, but only abnormal results are displayed) Labs Reviewed  COMPREHENSIVE METABOLIC PANEL - Abnormal; Notable for the following components:      Result Value   Potassium 3.1 (*)    Glucose, Bld 111 (*)    Calcium 8.5 (*)    Albumin 3.0 (*)    All other components within normal limits  CBC WITH DIFFERENTIAL/PLATELET - Abnormal; Notable for the following components:   WBC 11.6 (*)    RBC 4.13 (*)    Neutro Abs 10.1 (*)    All other components within normal limits  POC SARS CORONAVIRUS 2 AG -  ED    EKG None  Radiology Dg Chest Portable 1 View  Result Date: 12/13/2018 CLINICAL DATA:  68 year old male with generalized weakness and dry cough. COVID 19 exposure. EXAM: PORTABLE CHEST 1 VIEW COMPARISON:  Chest radiograph dated 12/11/2018.  FINDINGS: There is shallow inspiration. Bilateral predominantly peripheral and subpleural hazy densities, left greater right most consistent with developing infiltrate, likely atypical or viral in etiology. Overall progression of airspace densities compared to the prior radiograph. There is no pleural effusion or pneumothorax. Stable cardiac silhouette. No acute osseous pathology. IMPRESSION: Bilateral peripheral and subpleural hazy densities most consistent with developing infiltrate, likely atypical or viral in etiology. Follow-up recommended. Electronically Signed   By: Anner Crete M.D.   On: 12/13/2018  15:08    Procedures Procedures (including critical care time)  Medications Ordered in ED Medications  sodium chloride flush (NS) 0.9 % injection 3 mL (has no administration in time range)  acetaminophen (TYLENOL) tablet 1,000 mg (has no administration in time range)  metoCLOPramide (REGLAN) injection 10 mg (has no administration in time range)  potassium chloride SA (KLOR-CON) CR tablet 40 mEq (has no administration in time range)  cefTRIAXone (ROCEPHIN) 1 g in sodium chloride 0.9 % 100 mL IVPB (has no administration in time range)     Initial Impression / Assessment and Plan / ED Course  I have reviewed the triage vital signs and the nursing notes.  Pertinent labs & imaging results that were available during my care of the patient were reviewed by me and considered in my medical decision making (see chart for details).        68 year old male with no pertinent medical history presented today for worsening cough, fatigue, fever, shortness of breath for the past week.  Was seen 2 days ago and treated for Pneumonia with azithromycin which she has taken as directed without improvement in symptoms.  T-max at home of 103.  Patient given Tylenol, Reglan, potassium 40 mEq p.o.  Will assess CBC, CMP, chest x-ray, EKG and ambulate patient on pulse ox.  Patient low risk by Wells criteria  for pulmonary embolism.  Symptoms are more consistent with viral illness such as COVID-19.  Patient denies any chest pain or pleuritic discomfort.  States this feels nothing like prior PE.   On my personal review of the chest x-ray there appears to be a worsening left lower lobe infiltrate.  Radiological read indicates viral infiltrate.   We will treat with 1 dose of ceftriaxone and azithromycin.  Rapid Covid test positive.  Patient desaturates to 80% and is dizzy on ambulation.  Currently is on 4 L nasal cannula satting 95%.  Desats to 88% at rest without oxygen.  Patient continues to be tachypnic. Mild decrease in BP--at rest patient is asymptomatic. Will give 500 mL NS.   Patient appears to have new oxygen requirement secondary to Covid pneumonia.  Discussed with patient that he most likely needs hospital admission will be transferred to [place] hospitalist for admission Dr. Posey Pronto will admit.  7:15 PM    Charles King was evaluated in Emergency Department on 12/13/2018 for the symptoms described in the history of present illness. He was evaluated in the context of the global COVID-19 pandemic, which necessitated consideration that the patient might be at risk for infection with the SARS-CoV-2 virus that causes COVID-19. Institutional protocols and algorithms that pertain to the evaluation of patients at risk for COVID-19 are in a state of rapid change based on information released by regulatory bodies including the CDC and federal and state organizations. These policies and algorithms were followed during the patient's care in the ED.   Final Clinical Impressions(s) / ED Diagnoses   Final diagnoses:  None    ED Discharge Orders    None       Tedd Sias, [place] 12/13/18 1919    Blanchie Dessert, MD 12/15/18 2138

## 2018-12-13 NOTE — ED Notes (Signed)
Pt intermittently coughing with no acute resp distress.   Remains talkative and able to speak full sentences.   No current needs at this time.

## 2018-12-13 NOTE — ED Notes (Signed)
RA sats with slight ambulation drop to 80 with significant dizziness noted.

## 2018-12-13 NOTE — ED Notes (Signed)
[facility] called to transport pt to [facility]

## 2018-12-13 NOTE — ED Notes (Signed)
[facility] also called to transport pt to [facility]

## 2018-12-13 NOTE — ED Triage Notes (Signed)
Pt was exposed to covid last week, but his test was negative. Pt has generalized weakness, dry cough, shortness of breath and fevers. Taking tylenol with no relief.

## 2018-12-14 DIAGNOSIS — K219 Gastro-esophageal reflux disease without esophagitis: Secondary | ICD-10-CM

## 2018-12-14 LAB — CBC WITH DIFFERENTIAL/PLATELET
Abs Immature Granulocytes: 0.08 K/uL — ABNORMAL HIGH (ref 0.00–0.07)
Basophils Absolute: 0 K/uL (ref 0.0–0.1)
Basophils Relative: 0 %
Eosinophils Absolute: 0 K/uL (ref 0.0–0.5)
Eosinophils Relative: 0 %
HCT: 38 % — ABNORMAL LOW (ref 39.0–52.0)
Hemoglobin: 13 g/dL (ref 13.0–17.0)
Immature Granulocytes: 1 %
Lymphocytes Relative: 5 %
Lymphs Abs: 0.4 K/uL — ABNORMAL LOW (ref 0.7–4.0)
MCH: 33.7 pg (ref 26.0–34.0)
MCHC: 34.2 g/dL (ref 30.0–36.0)
MCV: 98.4 fL (ref 80.0–100.0)
Monocytes Absolute: 0.3 K/uL (ref 0.1–1.0)
Monocytes Relative: 3 %
Neutro Abs: 8.7 K/uL — ABNORMAL HIGH (ref 1.7–7.7)
Neutrophils Relative %: 91 %
Platelets: 178 K/uL (ref 150–400)
RBC: 3.86 MIL/uL — ABNORMAL LOW (ref 4.22–5.81)
RDW: 13.8 % (ref 11.5–15.5)
WBC: 9.5 K/uL (ref 4.0–10.5)
nRBC: 0 % (ref 0.0–0.2)

## 2018-12-14 LAB — COMPREHENSIVE METABOLIC PANEL
ALT: 28 U/L (ref 0–44)
AST: 33 U/L (ref 15–41)
Albumin: 3 g/dL — ABNORMAL LOW (ref 3.5–5.0)
Alkaline Phosphatase: 50 U/L (ref 38–126)
Anion gap: 10 (ref 5–15)
BUN: 17 mg/dL (ref 8–23)
CO2: 19 mmol/L — ABNORMAL LOW (ref 22–32)
Calcium: 8 mg/dL — ABNORMAL LOW (ref 8.9–10.3)
Chloride: 110 mmol/L (ref 98–111)
Creatinine, Ser: 1.02 mg/dL (ref 0.61–1.24)
GFR calc Af Amer: >60 mL/min (ref >60)
GFR calc non Af Amer: >60 mL/min (ref >60)
Glucose, Bld: 177 mg/dL — ABNORMAL HIGH (ref 70–99)
Potassium: 3.8 mmol/L (ref 3.5–5.1)
Sodium: 139 mmol/L (ref 135–145)
Total Bilirubin: 0.9 mg/dL (ref 0.3–1.2)
Total Protein: 6.5 g/dL (ref 6.5–8.1)

## 2018-12-14 LAB — D-DIMER, QUANTITATIVE: D-Dimer, Quant: 2 ug{FEU}/mL — ABNORMAL HIGH (ref 0.00–0.50)

## 2018-12-14 LAB — PHOSPHORUS: Phosphorus: 1.8 mg/dL — ABNORMAL LOW (ref 2.5–4.6)

## 2018-12-14 LAB — ABO/RH: ABO/RH(D): O NEG

## 2018-12-14 LAB — C-REACTIVE PROTEIN: CRP: 21.5 mg/dL — ABNORMAL HIGH (ref <1.0)

## 2018-12-14 LAB — HIV ANTIBODY (ROUTINE TESTING W REFLEX): HIV Screen 4th Generation wRfx: NONREACTIVE

## 2018-12-14 LAB — FERRITIN: Ferritin: 379 ng/mL — ABNORMAL HIGH (ref 24–336)

## 2018-12-14 LAB — MAGNESIUM: Magnesium: 2 mg/dL (ref 1.7–2.4)

## 2018-12-14 MED ORDER — METHYLPREDNISOLONE SODIUM SUCC 125 MG IJ SOLR
60.0000 mg | Freq: Two times a day (BID) | INTRAMUSCULAR | Status: DC
  Administered 2018-12-14 – 2018-12-15 (×3): 60 mg via INTRAVENOUS
  Filled 2018-12-14 (×2): qty 2
  Filled 2018-12-14: qty 0.96

## 2018-12-14 MED ORDER — SODIUM CHLORIDE 0.9 % IV SOLN
100.0000 mg | INTRAVENOUS | Status: AC
  Administered 2018-12-15 – 2018-12-17 (×3): 100 mg via INTRAVENOUS
  Filled 2018-12-14 (×3): qty 100

## 2018-12-14 MED ORDER — PNEUMOCOCCAL VAC POLYVALENT 25 MCG/0.5ML IJ INJ
0.5000 mL | INJECTION | INTRAMUSCULAR | Status: AC
  Administered 2018-12-20: 0.5 mL via INTRAMUSCULAR
  Filled 2018-12-14 (×2): qty 0.5

## 2018-12-14 MED ORDER — ENOXAPARIN SODIUM 60 MG/0.6ML ~~LOC~~ SOLN: 60.0000 mg | SUBCUTANEOUS | Status: DC

## 2018-12-14 MED ORDER — SODIUM CHLORIDE 0.9 % IV SOLN
100.0000 mg | INTRAVENOUS | Status: AC
  Administered 2018-12-14: 100 mg via INTRAVENOUS
  Filled 2018-12-14: qty 100

## 2018-12-14 MED ORDER — ORAL CARE MOUTH RINSE
15.0000 mL | Freq: Two times a day (BID) | OROMUCOSAL | Status: DC
  Administered 2018-12-14 – 2018-12-20 (×13): 15 mL via OROMUCOSAL

## 2018-12-14 MED ORDER — SODIUM CHLORIDE 0.9% IV SOLUTION
Freq: Once | INTRAVENOUS | Status: AC
  Administered 2018-12-14: 18:00:00 via INTRAVENOUS

## 2018-12-14 MED ORDER — ENOXAPARIN SODIUM 60 MG/0.6ML ~~LOC~~ SOLN
0.5000 mg/kg | Freq: Two times a day (BID) | SUBCUTANEOUS | Status: DC
  Administered 2018-12-14 – 2018-12-18 (×8): 55 mg via SUBCUTANEOUS
  Filled 2018-12-14 (×8): qty 0.6

## 2018-12-14 MED ORDER — TOCILIZUMAB 400 MG/20ML IV SOLN
800.0000 mg | Freq: Once | INTRAVENOUS | Status: AC
  Administered 2018-12-14: 12:00:00 800 mg via INTRAVENOUS
  Filled 2018-12-14: qty 40

## 2018-12-14 NOTE — Progress Notes (Signed)
PROGRESS NOTE  Charles King  [id number] DOB: 1950-05-27 DOA: 12/13/2018 PCP: Alroy Dust, L.Marlou Sa, MD   Brief Narrative: Charles King is a 68 y.o. male with a history of DVT and GERD who presented to the ED 11/29 with progressive shortness of breath, dry cough, and fevers. He was evaluated 11/27 for similar complaints with negative covid testing, given decadron x1 in ED and discharged on azithromycin. On repeat evaluation he was hypoxic, SARS-CoV-2 testing was positive, and CXR demonstrated interval development of bilateral peripheral and subpleural hazy densities. Remdesivir and steroids were started, and patient admitted to [facility]. Inflammatory markers were grossly elevated and rising with progressive hypoxia on 11/30 prompting administration of convalescent plasma and tocilizumab.  Assessment & Plan: Principal Problem:   Acute hypoxemic respiratory failure due to COVID-19 [facility]) Active Problems:   Hypokalemia   GERD (gastroesophageal reflux disease)  Acute hypoxic respiratory failure due to covid-19 pneumonia:  - Continue remdesivir x5 days - Continue steroids - Discussed EUA, rationale, known and potential risks as well as suspected but unproven benefits of convalescent plasma. Fact sheet provided and all questions answered and patient wishes to proceed. This will be administered.  - Continue airborne, contact precautions. PPE including surgical gown, gloves, cap, shoe covers, and CAPR used during this encounter in a negative pressure room.  - Check daily labs: CBC w/diff, CMP, d-dimer, ferritin, CRP - Maintain euvolemia/net negative.  - Avoid NSAIDs - Recommend proning and aggressive use of incentive spirometry. - Will give tocilizumab 800mg  IV x1. The patient is covid positive with progressive hypoxia and CRP >20x ULN. He denies immunocompromise, has no symptoms of diverticulitis/perforation. Platelets 178, ANC 8.7k, AST/ALT wnl and no knownTB or hepatitis B infection. The off-label  nature of this medication, as well as both positive and negative studies available to date were discussed. Patient wishes to proceed. Will monitor LFTs and for infusion reaction. - Goals of care were discussed. Prognosis is guarded.   History of DVT: This, in combination with severe covid, increase risk of thrombosis while admitted.  - Give intermediate dose lovenox (0.5mg /kg q12h)   DVT prophylaxis: Lovenox Code Status: Full Family Communication: None at bedside, pt to relay POC Disposition Plan: Uncertain  Consultants:   None  Procedures:   None  Antimicrobials:  Remdesivir 11/29 - 12/3   Subjective: Shortness of breath slightly worse today, no chest pain. Oxygen needs increased to 6L over past 24 hours. No leg swelling.  Objective: Vitals:   12/14/18 0000 12/14/18 0800 12/14/18 0905 12/14/18 0931  BP: 120/70   115/73  Pulse: 76   80  Resp: 18     Temp: 98.6 F (37 C)   99 F (37.2 C)  TempSrc: Oral   Oral  SpO2: 96% 93% (!) 87% 91%  Weight:      Height:        Intake/Output Summary (Last 24 hours) at 12/14/2018 1306 Last data filed at 12/14/2018 0700 Gross per 24 hour  Intake 100 ml  Output 800 ml  Net -700 ml   Filed Weights   12/13/18 1425  Weight: 114.8 kg    Gen: 68 y.o. male in no distress  Pulm: Non-labored tachypnea with 6LPM O2. Crackles bilaterally.  CV: Regular rate and rhythm. No murmur, rub, or gallop. No JVD, no pedal edema. GI: Abdomen soft, non-tender, non-distended, with normoactive bowel sounds. No organomegaly or masses felt. Ext: Warm, no deformities Skin: No rashes, lesions or ulcers Neuro: Alert and oriented. No focal neurological deficits. Psych: Judgement and  insight appear normal. Mood & affect appropriate.   Data Reviewed: I have personally reviewed following labs and imaging studies  CBC: Recent Labs  Lab 12/11/18 1606 12/13/18 1433 12/14/18 0212  WBC 5.0 11.6* 9.5  NEUTROABS 3.5 10.1* 8.7*  HGB 13.9 13.9 13.0  HCT  41.0 41.0 38.0*  MCV 98.1 99.3 98.4  PLT 122* 180 0000000   Basic Metabolic Panel: Recent Labs  Lab 12/11/18 1606 12/13/18 1433 12/13/18 1733 12/14/18 0212  NA 140 140  --  139  K 3.6 3.1*  --  3.8  CL 107 106  --  110  CO2 23 22  --  19*  GLUCOSE 111* 111*  --  177*  BUN 13 18  --  17  CREATININE 1.14 1.16  --  1.02  CALCIUM 8.2* 8.5*  --  8.0*  MG  --   --  1.9 2.0  PHOS  --   --   --  1.8*   GFR: Estimated Creatinine Clearance: 91.9 mL/min (by C-G formula based on SCr of 1.02 mg/dL). Liver Function Tests: Recent Labs  Lab 12/11/18 1606 12/13/18 1433 12/14/18 0212  AST 25 37 33  ALT 19 30 28   ALKPHOS 45 54 50  BILITOT 0.8 1.0 0.9  PROT 6.4* 6.7 6.5  ALBUMIN 3.2* 3.0* 3.0*   No results for input(s): LIPASE, AMYLASE in the last 168 hours. No results for input(s): AMMONIA in the last 168 hours. Coagulation Profile: Recent Labs  Lab 12/13/18 1753  INR 1.0   Cardiac Enzymes: No results for input(s): CKTOTAL, CKMB, CKMBINDEX, TROPONINI in the last 168 hours. BNP (last 3 results) No results for input(s): PROBNP in the last 8760 hours. HbA1C: No results for input(s): HGBA1C in the last 72 hours. CBG: No results for input(s): GLUCAP in the last 168 hours. Lipid Profile: Recent Labs    12/13/18 1753  TRIG 99   Thyroid Function Tests: No results for input(s): TSH, T4TOTAL, FREET4, T3FREE, THYROIDAB in the last 72 hours. Anemia Panel: Recent Labs    12/13/18 1753 12/14/18 0212  FERRITIN 393* 379*   Urine analysis: No results found for: COLORURINE, APPEARANCEUR, LABSPEC, PHURINE, GLUCOSEU, HGBUR, BILIRUBINUR, KETONESUR, PROTEINUR, UROBILINOGEN, NITRITE, LEUKOCYTESUR Recent Results (from the past 240 hour(s))  SARS CORONAVIRUS 2 (TAT 6-24 HRS) Nasopharyngeal Nasopharyngeal Swab     Status: None   Collection Time: 12/11/18  4:06 PM   Specimen: Nasopharyngeal Swab  Result Value Ref Range Status   SARS Coronavirus 2 NEGATIVE NEGATIVE Final    Comment: (NOTE)  SARS-CoV-2 target nucleic acids are NOT DETECTED. The SARS-CoV-2 RNA is generally detectable in upper and lower respiratory specimens during the acute phase of infection. Negative results do not preclude SARS-CoV-2 infection, do not rule out co-infections with other pathogens, and should not be used as the sole basis for treatment or other patient management decisions. Negative results must be combined with clinical observations, patient history, and epidemiological information. The expected result is Negative. Fact Sheet for Patients: SugarRoll.be Fact Sheet for Healthcare Providers: https://www.woods-mathews.com/ This test is not yet approved or cleared by the [place] FDA and  has been authorized for detection and/or diagnosis of SARS-CoV-2 by FDA under an Emergency Use Authorization (EUA). This EUA will remain  in effect (meaning this test can be used) for the duration of the COVID-19 declaration under Section 56 4(b)(1) of the Act, 21 U.S.C. section 360bbb-3(b)(1), unless the authorization is terminated or revoked sooner. Performed at [facility], [place]  [address], [place], [place] [address]   Blood Culture (routine x 2)     Status: None (Preliminary result)   Collection Time: 12/13/18  7:43 PM   Specimen: BLOOD  Result Value Ref Range Status   Specimen Description BLOOD RIGHT ANTECUBITAL  Final   Special Requests   Final    BOTTLES DRAWN AEROBIC AND ANAEROBIC Blood Culture results may not be optimal due to an inadequate volume of blood received in culture bottles   Culture   Final    NO GROWTH < 12 HOURS Performed at [facility], [place] [address], [place], [place] [address]    Report Status PENDING  Incomplete  Blood Culture (routine x 2)     Status: None (Preliminary result)   Collection Time: 12/13/18  7:46 PM   Specimen: BLOOD RIGHT HAND  Result Value Ref Range Status   Specimen Description BLOOD RIGHT HAND   Final   Special Requests   Final    BOTTLES DRAWN AEROBIC AND ANAEROBIC Blood Culture results may not be optimal due to an inadequate volume of blood received in culture bottles   Culture   Final    NO GROWTH < 12 HOURS Performed at [facility], [place] [address], [place], [place] [address]    Report Status PENDING  Incomplete      Radiology Studies: Dg Chest Portable 1 View  Result Date: 12/13/2018 CLINICAL DATA:  68 year old male with generalized weakness and dry cough. COVID 19 exposure. EXAM: PORTABLE CHEST 1 VIEW COMPARISON:  Chest radiograph dated 12/11/2018. FINDINGS: There is shallow inspiration. Bilateral predominantly peripheral and subpleural hazy densities, left greater right most consistent with developing infiltrate, likely atypical or viral in etiology. Overall progression of airspace densities compared to the prior radiograph. There is no pleural effusion or pneumothorax. Stable cardiac silhouette. No acute osseous pathology. IMPRESSION: Bilateral peripheral and subpleural hazy densities most consistent with developing infiltrate, likely atypical or viral in etiology. Follow-up recommended. Electronically Signed   By: Anner Crete M.D.   On: 12/13/2018 15:08    Scheduled Meds: . sodium chloride   Intravenous Once  . albuterol  2 puff Inhalation Q6H  . enoxaparin (LOVENOX) injection  60 mg Subcutaneous Q24H  . mouth rinse  15 mL Mouth Rinse BID  . methylPREDNISolone (SOLU-MEDROL) injection  60 mg Intravenous Q12H  . pantoprazole  40 mg Oral Daily  . [START ON 12/15/2018] pneumococcal 23 valent vaccine  0.5 mL Intramuscular Tomorrow-1000  . sodium chloride flush  3 mL Intravenous Once  . vitamin C  500 mg Oral Daily  . zinc sulfate  220 mg Oral Daily   Continuous Infusions: . remdesivir 100 mg in NS 250 mL    . [START ON 12/15/2018] remdesivir 100 mg in NS 250 mL    . tocilizumab (ACTEMRA) IV 800 mg (12/14/18 1226)     LOS: 1 day   Time spent: 35 minutes.   Patrecia Pour, MD [facility] [email or web address] 12/14/2018, 1:06 PM

## 2018-12-14 NOTE — Progress Notes (Addendum)
   12/14/18 0905  Oxygen Therapy  SpO2 (!) 87 %  O2 Device HFNC  O2 Flow Rate (L/min) 4 L/min  applied HFNC educated on prone position, pt states it will hurt his neck to lay prone. 1000  IS education pt returned demonstration 750 goal at this time. Pt transferred to chair

## 2018-12-15 LAB — COMPREHENSIVE METABOLIC PANEL
ALT: 26 U/L (ref 0–44)
AST: 25 U/L (ref 15–41)
Albumin: 2.7 g/dL — ABNORMAL LOW (ref 3.5–5.0)
Alkaline Phosphatase: 48 U/L (ref 38–126)
Anion gap: 10 (ref 5–15)
BUN: 24 mg/dL — ABNORMAL HIGH (ref 8–23)
CO2: 21 mmol/L — ABNORMAL LOW (ref 22–32)
Calcium: 8.5 mg/dL — ABNORMAL LOW (ref 8.9–10.3)
Chloride: 109 mmol/L (ref 98–111)
Creatinine, Ser: 0.87 mg/dL (ref 0.61–1.24)
GFR calc Af Amer: >60 mL/min (ref >60)
GFR calc non Af Amer: >60 mL/min (ref >60)
Glucose, Bld: 150 mg/dL — ABNORMAL HIGH (ref 70–99)
Potassium: 3.8 mmol/L (ref 3.5–5.1)
Sodium: 140 mmol/L (ref 135–145)
Total Bilirubin: 0.3 mg/dL (ref 0.3–1.2)
Total Protein: 6.5 g/dL (ref 6.5–8.1)

## 2018-12-15 LAB — CBC WITH DIFFERENTIAL/PLATELET
Abs Immature Granulocytes: 0.08 10*3/uL — ABNORMAL HIGH (ref 0.00–0.07)
Basophils Absolute: 0 10*3/uL (ref 0.0–0.1)
Basophils Relative: 0 %
Eosinophils Absolute: 0 10*3/uL (ref 0.0–0.5)
Eosinophils Relative: 0 %
HCT: 35.5 % — ABNORMAL LOW (ref 39.0–52.0)
Hemoglobin: 11.9 g/dL — ABNORMAL LOW (ref 13.0–17.0)
Immature Granulocytes: 1 %
Lymphocytes Relative: 9 %
Lymphs Abs: 0.6 10*3/uL — ABNORMAL LOW (ref 0.7–4.0)
MCH: 32.5 pg (ref 26.0–34.0)
MCHC: 33.5 g/dL (ref 30.0–36.0)
MCV: 97 fL (ref 80.0–100.0)
Monocytes Absolute: 0.3 10*3/uL (ref 0.1–1.0)
Monocytes Relative: 4 %
Neutro Abs: 6.1 10*3/uL (ref 1.7–7.7)
Neutrophils Relative %: 86 %
Platelets: 212 10*3/uL (ref 150–400)
RBC: 3.66 MIL/uL — ABNORMAL LOW (ref 4.22–5.81)
RDW: 13.9 % (ref 11.5–15.5)
WBC: 7.1 10*3/uL (ref 4.0–10.5)
nRBC: 0 % (ref 0.0–0.2)

## 2018-12-15 LAB — PREPARE FRESH FROZEN PLASMA: Unit division: 0

## 2018-12-15 LAB — FERRITIN: Ferritin: 326 ng/mL (ref 24–336)

## 2018-12-15 LAB — C-REACTIVE PROTEIN: CRP: 17.4 mg/dL — ABNORMAL HIGH (ref <1.0)

## 2018-12-15 LAB — PHOSPHORUS: Phosphorus: 2.3 mg/dL — ABNORMAL LOW (ref 2.5–4.6)

## 2018-12-15 LAB — BPAM FFP
Blood Product Expiration Date: 202012011350
ISSUE DATE / TIME: 202011301413
Unit Type and Rh: 5100

## 2018-12-15 LAB — D-DIMER, QUANTITATIVE: D-Dimer, Quant: 1.53 ug/mL-FEU — ABNORMAL HIGH (ref 0.00–0.50)

## 2018-12-15 LAB — MAGNESIUM: Magnesium: 2 mg/dL (ref 1.7–2.4)

## 2018-12-15 LAB — HIV ANTIBODY (ROUTINE TESTING W REFLEX): HIV Screen 4th Generation wRfx: NONREACTIVE

## 2018-12-15 MED ORDER — FUROSEMIDE 10 MG/ML IJ SOLN
40.0000 mg | Freq: Once | INTRAMUSCULAR | Status: AC
  Administered 2018-12-15: 40 mg via INTRAVENOUS
  Filled 2018-12-15: qty 4

## 2018-12-15 MED ORDER — K PHOS MONO-SOD PHOS DI & MONO 155-852-130 MG PO TABS
250.0000 mg | ORAL_TABLET | Freq: Every day | ORAL | Status: DC
  Administered 2018-12-15 – 2018-12-20 (×6): 250 mg via ORAL
  Filled 2018-12-15 (×6): qty 1

## 2018-12-15 MED ORDER — ALUM & MAG HYDROXIDE-SIMETH 200-200-20 MG/5ML PO SUSP
30.0000 mL | Freq: Four times a day (QID) | ORAL | Status: DC | PRN
  Administered 2018-12-15 – 2018-12-16 (×3): 30 mL via ORAL
  Filled 2018-12-15 (×3): qty 30

## 2018-12-15 NOTE — Progress Notes (Signed)
Wife dropped off lap top for pt

## 2018-12-15 NOTE — Progress Notes (Signed)
Spoke with pt's wife, Hinton Dyer and answered her questions regarding current POC. Wife updated about pt receiving FFP and new medication, Actemra.

## 2018-12-15 NOTE — Progress Notes (Signed)
PROGRESS NOTE  Charles King  [id number] DOB: 1950/03/05 DOA: 12/13/2018 PCP: Alroy Dust, L.Marlou Sa, MD   Brief Narrative: Charles King is a 68 y.o. male with a history of DVT and GERD who presented to the ED 11/29 with progressive shortness of breath, dry cough, and fevers. He was evaluated 11/27 for similar complaints with negative covid testing, given decadron x1 in ED and discharged on azithromycin. On repeat evaluation he was hypoxic, SARS-CoV-2 testing was positive, and CXR demonstrated interval development of bilateral peripheral and subpleural hazy densities. Remdesivir and steroids were started, and patient admitted to [facility]. Inflammatory markers were grossly elevated and rising with progressive hypoxia on 11/30 prompting administration of convalescent plasma and tocilizumab.  Assessment & Plan: Principal Problem:   Acute hypoxemic respiratory failure due to COVID-19 [facility]) Active Problems:   Hypokalemia   GERD (gastroesophageal reflux disease)  Acute hypoxic respiratory failure due to covid-19 pneumonia:  - Continue supplemental oxygen to maintain SpO2 >85% with exertion or prn heightened respiratory effort - Continue remdesivir x5 days (11/29 - 12/3) - Continue steroids - s/p CCP and tocilizumab 11/30 - Trial lasix x1 today - Continue airborne, contact precautions. PPE including surgical gown, gloves, cap, shoe covers, and CAPR used during this encounter in a negative pressure room.  - Check daily labs: CBC w/diff, CMP, d-dimer, ferritin, CRP - Maintain euvolemia/net negative.  - Avoid NSAIDs - Recommend proning and aggressive use of incentive spirometry. - Goals of care were discussed. Prognosis is guarded. Would transfer to ICU if hypoxia continues to worsen requiring HHF.   History of DVT: This, in combination with severe covid, increase risk of thrombosis while admitted.  - Giving intermediate dose lovenox (0.5mg /kg q12h)   Hypophosphatemia:  - Supplement daily  DVT  prophylaxis: Lovenox Code Status: Full Family Communication: None at bedside, pt to relay POC Disposition Plan: Uncertain  Consultants:   None  Procedures:   None  Antimicrobials:  Remdesivir 11/29 - 12/3   Subjective: Severely short of breath on exertion for bathroom ADLs, into 80%'s on 10L. At rest, less dyspnea, about the same as yesterday though had required 8-10L O2 today vs. 4-6L yesterday. No chest pain, leg swelling/tenderness. No bleeding mentioned.  Objective: Vitals:   12/14/18 2338 12/14/18 2342 12/15/18 0448 12/15/18 0800  BP:   (!) 91/54 107/71  Pulse:  65  62  Resp:   18 17  Temp:   98.2 F (36.8 C) (!) 97.5 F (36.4 C)  TempSrc:   Oral Oral  SpO2: (!) 87% 91% 94% 90%  Weight:      Height:        Intake/Output Summary (Last 24 hours) at 12/15/2018 1104 Last data filed at 12/15/2018 1043 Gross per 24 hour  Intake 484 ml  Output 600 ml  Net -116 ml   Filed Weights   12/13/18 1425  Weight: 114.8 kg   Gen: 68 y.o. male in no distress sitting in chair Pulm: Nonlabored tachypnea on 10L, bilateraly bibasilar crackles without wheezes. CV: Regular rate and rhythm. No murmur, rub, or gallop. No JVD, no dependent edema. GI: Abdomen soft, non-tender, non-distended, with normoactive bowel sounds.  Ext: Warm, no deformities Skin: No rashes, lesions or ulcers on visualized skin. Neuro: Alert and oriented. No focal neurological deficits. Psych: Judgement and insight appear fair. Mood euthymic & affect congruent. Behavior is appropriate.    Data Reviewed: I have personally reviewed following labs and imaging studies  CBC: Recent Labs  Lab 12/11/18 1606 12/13/18 1433 12/14/18 0212 12/15/18  0251  WBC 5.0 11.6* 9.5 7.1  NEUTROABS 3.5 10.1* 8.7* 6.1  HGB 13.9 13.9 13.0 11.9*  HCT 41.0 41.0 38.0* 35.5*  MCV 98.1 99.3 98.4 97.0  PLT 122* 180 178 [id number]   Basic Metabolic Panel: Recent Labs  Lab 12/11/18 1606 12/13/18 1433 12/13/18 1733 12/14/18 0212  12/15/18 0251  NA 140 140  --  139 140  K 3.6 3.1*  --  3.8 3.8  CL 107 106  --  110 109  CO2 23 22  --  19* 21*  GLUCOSE 111* 111*  --  177* 150*  BUN 13 18  --  17 24*  CREATININE 1.14 1.16  --  1.02 0.87  CALCIUM 8.2* 8.5*  --  8.0* 8.5*  MG  --   --  1.9 2.0 2.0  PHOS  --   --   --  1.8* 2.3*   GFR: Estimated Creatinine Clearance: 107.8 mL/min (by C-G formula based on SCr of 0.87 mg/dL). Liver Function Tests: Recent Labs  Lab 12/11/18 1606 12/13/18 1433 12/14/18 0212 12/15/18 0251  AST 25 37 33 25  ALT 19 30 28 26   ALKPHOS 45 54 50 48  BILITOT 0.8 1.0 0.9 0.3  PROT 6.4* 6.7 6.5 6.5  ALBUMIN 3.2* 3.0* 3.0* 2.7*   No results for input(s): LIPASE, AMYLASE in the last 168 hours. No results for input(s): AMMONIA in the last 168 hours. Coagulation Profile: Recent Labs  Lab 12/13/18 1753  INR 1.0   Cardiac Enzymes: No results for input(s): CKTOTAL, CKMB, CKMBINDEX, TROPONINI in the last 168 hours. BNP (last 3 results) No results for input(s): PROBNP in the last 8760 hours. HbA1C: No results for input(s): HGBA1C in the last 72 hours. CBG: No results for input(s): GLUCAP in the last 168 hours. Lipid Profile: Recent Labs    12/13/18 1753  TRIG 99   Thyroid Function Tests: No results for input(s): TSH, T4TOTAL, FREET4, T3FREE, THYROIDAB in the last 72 hours. Anemia Panel: Recent Labs    12/14/18 0212 12/15/18 0251  FERRITIN 379* 326   Urine analysis: No results found for: COLORURINE, APPEARANCEUR, LABSPEC, PHURINE, GLUCOSEU, HGBUR, BILIRUBINUR, KETONESUR, PROTEINUR, UROBILINOGEN, NITRITE, LEUKOCYTESUR Recent Results (from the past 240 hour(s))  SARS CORONAVIRUS 2 (TAT 6-24 HRS) Nasopharyngeal Nasopharyngeal Swab     Status: None   Collection Time: 12/11/18  4:06 PM   Specimen: Nasopharyngeal Swab  Result Value Ref Range Status   SARS Coronavirus 2 NEGATIVE NEGATIVE Final    Comment: (NOTE) SARS-CoV-2 target nucleic acids are NOT DETECTED. The SARS-CoV-2  RNA is generally detectable in upper and lower respiratory specimens during the acute phase of infection. Negative results do not preclude SARS-CoV-2 infection, do not rule out co-infections with other pathogens, and should not be used as the sole basis for treatment or other patient management decisions. Negative results must be combined with clinical observations, patient history, and epidemiological information. The expected result is Negative. Fact Sheet for Patients: SugarRoll.be Fact Sheet for Healthcare Providers: https://www.woods-mathews.com/ This test is not yet approved or cleared by the [place] FDA and  has been authorized for detection and/or diagnosis of SARS-CoV-2 by FDA under an Emergency Use Authorization (EUA). This EUA will remain  in effect (meaning this test can be used) for the duration of the COVID-19 declaration under Section 56 4(b)(1) of the Act, 21 U.S.C. section 360bbb-3(b)(1), unless the authorization is terminated or revoked sooner. Performed at [facility], [place] [address], [place], [place] [address]  Blood Culture (routine x 2)     Status: None (Preliminary result)   Collection Time: 12/13/18  7:43 PM   Specimen: BLOOD  Result Value Ref Range Status   Specimen Description BLOOD RIGHT ANTECUBITAL  Final   Special Requests   Final    BOTTLES DRAWN AEROBIC AND ANAEROBIC Blood Culture results may not be optimal due to an inadequate volume of blood received in culture bottles   Culture   Final    NO GROWTH 2 DAYS Performed at [facility], [place] [address], [place], [place] [address]    Report Status PENDING  Incomplete  Blood Culture (routine x 2)     Status: None (Preliminary result)   Collection Time: 12/13/18  7:46 PM   Specimen: BLOOD RIGHT HAND  Result Value Ref Range Status   Specimen Description BLOOD RIGHT HAND  Final   Special Requests   Final    BOTTLES DRAWN AEROBIC AND ANAEROBIC  Blood Culture results may not be optimal due to an inadequate volume of blood received in culture bottles   Culture   Final    NO GROWTH 2 DAYS Performed at [facility], [place] [address] [place], [place] [address]    Report Status PENDING  Incomplete      Radiology Studies: Dg Chest Portable 1 View  Result Date: 12/13/2018 CLINICAL DATA:  68 year old male with generalized weakness and dry cough. COVID 19 exposure. EXAM: PORTABLE CHEST 1 VIEW COMPARISON:  Chest radiograph dated 12/11/2018. FINDINGS: There is shallow inspiration. Bilateral predominantly peripheral and subpleural hazy densities, left greater right most consistent with developing infiltrate, likely atypical or viral in etiology. Overall progression of airspace densities compared to the prior radiograph. There is no pleural effusion or pneumothorax. Stable cardiac silhouette. No acute osseous pathology. IMPRESSION: Bilateral peripheral and subpleural hazy densities most consistent with developing infiltrate, likely atypical or viral in etiology. Follow-up recommended. Electronically Signed   By: Anner Crete M.D.   On: 12/13/2018 15:08    Scheduled Meds: . albuterol  2 puff Inhalation Q6H  . enoxaparin (LOVENOX) injection  0.5 mg/kg Subcutaneous Q12H  . furosemide  40 mg Intravenous Once  . mouth rinse  15 mL Mouth Rinse BID  . methylPREDNISolone (SOLU-MEDROL) injection  60 mg Intravenous Q12H  . pantoprazole  40 mg Oral Daily  . phosphorus  250 mg Oral Daily  . pneumococcal 23 valent vaccine  0.5 mL Intramuscular Tomorrow-1000  . sodium chloride flush  3 mL Intravenous Once  . vitamin C  500 mg Oral Daily  . zinc sulfate  220 mg Oral Daily   Continuous Infusions: . remdesivir 100 mg in NS 250 mL 100 mg (12/15/18 0956)     LOS: 2 days   Time spent: 35 minutes.  Patrecia Pour, MD [facility] [email or web address] 12/15/2018, 11:04 AM

## 2018-12-15 NOTE — Progress Notes (Signed)
   12/15/18 0832  Mobility  Activity Ambulated in room;Transferred:  Bed to chair  Mobility Response Tolerated fair  Hygiene  Oral Care Teeth brushed  pt desats to 84% on  10 L HFNC with activity

## 2018-12-16 DIAGNOSIS — U071 COVID-19: Secondary | ICD-10-CM

## 2018-12-16 DIAGNOSIS — J1289 Other viral pneumonia: Secondary | ICD-10-CM

## 2018-12-16 LAB — CBC WITH DIFFERENTIAL/PLATELET
Abs Immature Granulocytes: 0.14 10*3/uL — ABNORMAL HIGH (ref 0.00–0.07)
Basophils Absolute: 0 10*3/uL (ref 0.0–0.1)
Basophils Relative: 0 %
Eosinophils Absolute: 0 10*3/uL (ref 0.0–0.5)
Eosinophils Relative: 0 %
HCT: 35.5 % — ABNORMAL LOW (ref 39.0–52.0)
Hemoglobin: 11.9 g/dL — ABNORMAL LOW (ref 13.0–17.0)
Immature Granulocytes: 1 %
Lymphocytes Relative: 8 %
Lymphs Abs: 0.9 10*3/uL (ref 0.7–4.0)
MCH: 32.9 pg (ref 26.0–34.0)
MCHC: 33.5 g/dL (ref 30.0–36.0)
MCV: 98.1 fL (ref 80.0–100.0)
Monocytes Absolute: 0.8 10*3/uL (ref 0.1–1.0)
Monocytes Relative: 7 %
Neutro Abs: 9.4 10*3/uL — ABNORMAL HIGH (ref 1.7–7.7)
Neutrophils Relative %: 84 %
Platelets: 263 10*3/uL (ref 150–400)
RBC: 3.62 MIL/uL — ABNORMAL LOW (ref 4.22–5.81)
RDW: 14 % (ref 11.5–15.5)
WBC: 11.2 10*3/uL — ABNORMAL HIGH (ref 4.0–10.5)
nRBC: 0 % (ref 0.0–0.2)

## 2018-12-16 LAB — COMPREHENSIVE METABOLIC PANEL
ALT: 37 U/L (ref 0–44)
AST: 33 U/L (ref 15–41)
Albumin: 2.8 g/dL — ABNORMAL LOW (ref 3.5–5.0)
Alkaline Phosphatase: 54 U/L (ref 38–126)
Anion gap: 13 (ref 5–15)
BUN: 33 mg/dL — ABNORMAL HIGH (ref 8–23)
CO2: 22 mmol/L (ref 22–32)
Calcium: 8.7 mg/dL — ABNORMAL LOW (ref 8.9–10.3)
Chloride: 106 mmol/L (ref 98–111)
Creatinine, Ser: 1.02 mg/dL (ref 0.61–1.24)
GFR calc Af Amer: >60 mL/min (ref >60)
GFR calc non Af Amer: >60 mL/min (ref >60)
Glucose, Bld: 141 mg/dL — ABNORMAL HIGH (ref 70–99)
Potassium: 3.7 mmol/L (ref 3.5–5.1)
Sodium: 141 mmol/L (ref 135–145)
Total Bilirubin: 0.3 mg/dL (ref 0.3–1.2)
Total Protein: 6.4 g/dL — ABNORMAL LOW (ref 6.5–8.1)

## 2018-12-16 LAB — FERRITIN: Ferritin: 417 ng/mL — ABNORMAL HIGH (ref 24–336)

## 2018-12-16 LAB — D-DIMER, QUANTITATIVE: D-Dimer, Quant: 1.17 ug/mL-FEU — ABNORMAL HIGH (ref 0.00–0.50)

## 2018-12-16 LAB — HEMOGLOBIN A1C
Hgb A1c MFr Bld: 5.9 % — ABNORMAL HIGH (ref 4.8–5.6)
Mean Plasma Glucose: 122.63 mg/dL

## 2018-12-16 LAB — C-REACTIVE PROTEIN: CRP: 9 mg/dL — ABNORMAL HIGH (ref <1.0)

## 2018-12-16 LAB — GLUCOSE, CAPILLARY
Glucose-Capillary: 191 mg/dL — ABNORMAL HIGH (ref 70–99)
Glucose-Capillary: 259 mg/dL — ABNORMAL HIGH (ref 70–99)
Glucose-Capillary: 158 mg/dL — ABNORMAL HIGH (ref 70–99)
Glucose-Capillary: 167 mg/dL — ABNORMAL HIGH (ref 70–99)

## 2018-12-16 LAB — PHOSPHORUS: Phosphorus: 2.9 mg/dL (ref 2.5–4.6)

## 2018-12-16 LAB — MAGNESIUM: Magnesium: 2 mg/dL (ref 1.7–2.4)

## 2018-12-16 MED ORDER — DEXAMETHASONE SODIUM PHOSPHATE 10 MG/ML IJ SOLN
6.0000 mg | Freq: Two times a day (BID) | INTRAMUSCULAR | Status: DC
  Administered 2018-12-16 – 2018-12-19 (×9): 6 mg via INTRAVENOUS
  Filled 2018-12-16 (×8): qty 1

## 2018-12-16 MED ORDER — INSULIN ASPART 100 UNIT/ML ~~LOC~~ SOLN
3.0000 [IU] | Freq: Three times a day (TID) | SUBCUTANEOUS | Status: DC
  Administered 2018-12-16 – 2018-12-20 (×11): 3 [IU] via SUBCUTANEOUS

## 2018-12-16 MED ORDER — INSULIN ASPART 100 UNIT/ML ~~LOC~~ SOLN
0.0000 [IU] | Freq: Three times a day (TID) | SUBCUTANEOUS | Status: DC
  Administered 2018-12-16: 3 [IU] via SUBCUTANEOUS
  Administered 2018-12-16: 8 [IU] via SUBCUTANEOUS
  Administered 2018-12-17 – 2018-12-19 (×5): 3 [IU] via SUBCUTANEOUS

## 2018-12-16 MED ORDER — INSULIN DETEMIR 100 UNIT/ML ~~LOC~~ SOLN
5.0000 [IU] | Freq: Every day | SUBCUTANEOUS | Status: DC
  Administered 2018-12-16 – 2018-12-19 (×4): 5 [IU] via SUBCUTANEOUS
  Filled 2018-12-16 (×5): qty 0.05

## 2018-12-16 MED ORDER — INSULIN ASPART 100 UNIT/ML ~~LOC~~ SOLN: 0.0000 [IU] | Freq: Every day | SUBCUTANEOUS | Status: DC

## 2018-12-16 NOTE — Progress Notes (Signed)
   12/16/18 1306  Mobility  Activity Turned to stomach (prone)  tolerating well. Education provided

## 2018-12-16 NOTE — Progress Notes (Signed)
[facility] PROGRESS NOTE    Progress Note  Charles King  [id number] DOB: [birth year] DOA: 12/13/2018 PCP: Alroy Dust, L.Marlou Sa, MD     Brief Narrative:   Charles King is an 68 y.o. male past medical history of DVT, GERD who presents to the ED on 11/29 with progressive shortness of breath dry cough and fevers.  He was evaluated on 12/11/2018 for similar complaint tested negative for SARS-CoV-2, was given Decadron x1 in the ED and discharged home on azithromycin. Came back to the ED hypoxic and tested positive for SARS Covid 2 chest x-ray demonstrates interval development of bilateral peripheral infiltrates. Started on IV remdesivir and steroids and transferred to [facility].  Assessment/Plan:   Acute respiratory failure with hypoxia secondary to COVID-19 pneumonia: On 8 L of high flow nasal cannula to keep saturations greater than 85%. His inflammatory markers are significantly improved.  Continue IV remdesivir for 5 days, will change his steroids to IV Decadron.  He is status post Actemra on 12/14/2018. Try to keep the patient prone for at least 16 hours a day. Try to keep euvolemic, strict I's and O's and daily weights. Continue vitamin C and zinc.  History of DVT: Switch him back to enoxaparin for DVT prophylaxis  Hypophosphatemia: Repleted orally now improved.  DVT prophylaxis: lovenox Family Communication:None Disposition Plan/Barrier to D/C: unable to determine Code Status:     Code Status Orders  (From admission, onward)         Start     Ordered   12/13/18 1951  Full code  Continuous     12/13/18 1954        Code Status History    This patient has a current code status but no historical code status.   Advance Care Planning Activity        IV Access:    Peripheral IV   Procedures and diagnostic studies:   No results found.   Medical Consultants:    None.  Anti-Infectives:   IV remdesivir  Subjective:    Charles King irritated this morning and tired due to all the alarms.  Objective:    Vitals:   12/15/18 1204 12/15/18 2048 12/16/18 0506 12/16/18 0806  BP:  103/62 109/73   Pulse:   91   Resp:  20 20 20   Temp:  98 F (36.7 C) 98.1 F (36.7 C) 97.9 F (36.6 C)  TempSrc:  Oral Oral Oral  SpO2: 95%  91%   Weight:      Height:       SpO2: 91 % O2 Flow Rate (L/min): 8 L/min   Intake/Output Summary (Last 24 hours) at 12/16/2018 0817 Last data filed at 12/15/2018 2300 Gross per 24 hour  Intake 350 ml  Output 2100 ml  Net -1750 ml   Filed Weights   12/13/18 1425  Weight: 114.8 kg    Exam: General exam: In no acute distress. Respiratory system: Good air movement and diffuse crackles bilaterally. Cardiovascular system: S1 & S2 heard, RRR. Gastrointestinal system: Abdomen is nondistended, soft and nontender.  Central nervous system: Alert and oriented. No focal neurological deficits. Extremities: No pedal edema. Skin: No rashes, lesions or ulcers Psychiatry: Judgement and insight appear normal. Mood & affect appropriate.    Data Reviewed:    Labs: Basic Metabolic Panel: Recent Labs  Lab 12/11/18 1606 12/13/18 1433 12/13/18 1733 12/14/18 0212 12/15/18 0251 12/16/18 0010  NA 140 140  --  139 140 141  K 3.6 3.1*  --  3.8 3.8 3.7  CL 107 106  --  110 109 106  CO2 23 22  --  19* 21* 22  GLUCOSE 111* 111*  --  177* 150* 141*  BUN 13 18  --  17 24* 33*  CREATININE 1.14 1.16  --  1.02 0.87 1.02  CALCIUM 8.2* 8.5*  --  8.0* 8.5* 8.7*  MG  --   --  1.9 2.0 2.0 2.0  PHOS  --   --   --  1.8* 2.3* 2.9   GFR Estimated Creatinine Clearance: 91.9 mL/min (by C-G formula based on SCr of 1.02 mg/dL). Liver Function Tests: Recent Labs  Lab 12/11/18 1606 12/13/18 1433 12/14/18 0212 12/15/18 0251 12/16/18 0010  AST 25 37 33 25 33  ALT 19 30 28 26  37  ALKPHOS 45 54 50 48 54  BILITOT 0.8 1.0 0.9 0.3 0.3  PROT 6.4* 6.7 6.5 6.5 6.4*  ALBUMIN 3.2* 3.0* 3.0* 2.7* 2.8*   No  results for input(s): LIPASE, AMYLASE in the last 168 hours. No results for input(s): AMMONIA in the last 168 hours. Coagulation profile Recent Labs  Lab 12/13/18 1753  INR 1.0   COVID-19 Labs  Recent Labs    12/13/18 1753 12/14/18 0212 12/15/18 0251 12/16/18 0010  DDIMER 2.32* 2.00* 1.53* 1.17*  FERRITIN 393* 379* 326 417*  LDH 295*  --   --   --   CRP 20.6* 21.5* 17.4* 9.0*    Lab Results  Component Value Date   SARSCOV2NAA NEGATIVE 12/11/2018   [place] Not Detected 12/04/2018    CBC: Recent Labs  Lab 12/11/18 1606 12/13/18 1433 12/14/18 0212 12/15/18 0251 12/16/18 0010  WBC 5.0 11.6* 9.5 7.1 11.2*  NEUTROABS 3.5 10.1* 8.7* 6.1 9.4*  HGB 13.9 13.9 13.0 11.9* 11.9*  HCT 41.0 41.0 38.0* 35.5* 35.5*  MCV 98.1 99.3 98.4 97.0 98.1  PLT 122* 180 178 212 263   Cardiac Enzymes: No results for input(s): CKTOTAL, CKMB, CKMBINDEX, TROPONINI in the last 168 hours. BNP (last 3 results) No results for input(s): PROBNP in the last 8760 hours. CBG: No results for input(s): GLUCAP in the last 168 hours. D-Dimer: Recent Labs    12/15/18 0251 12/16/18 0010  DDIMER 1.53* 1.17*   Hgb A1c: No results for input(s): HGBA1C in the last 72 hours. Lipid Profile: Recent Labs    12/13/18 1753  TRIG 99   Thyroid function studies: No results for input(s): TSH, T4TOTAL, T3FREE, THYROIDAB in the last 72 hours.  Invalid input(s): FREET3 Anemia work up: Recent Labs    12/15/18 0251 12/16/18 0010  FERRITIN 326 417*   Sepsis Labs: Recent Labs  Lab 12/13/18 1433 12/13/18 1753 12/13/18 1917 12/14/18 0212 12/15/18 0251 12/16/18 0010  PROCALCITON  --  0.43  --   --   --   --   WBC 11.6*  --   --  9.5 7.1 11.2*  LATICACIDVEN  --  1.8 1.7  --   --   --    Microbiology Recent Results (from the past 240 hour(s))  SARS CORONAVIRUS 2 (TAT 6-24 HRS) Nasopharyngeal Nasopharyngeal Swab     Status: None   Collection Time: 12/11/18  4:06 PM   Specimen: Nasopharyngeal  Swab  Result Value Ref Range Status   SARS Coronavirus 2 NEGATIVE NEGATIVE Final    Comment: (NOTE) SARS-CoV-2 target nucleic acids are NOT DETECTED. The SARS-CoV-2 RNA is generally detectable in upper and lower respiratory specimens during the acute phase of infection. Negative results  do not preclude SARS-CoV-2 infection, do not rule out co-infections with other pathogens, and should not be used as the sole basis for treatment or other patient management decisions. Negative results must be combined with clinical observations, patient history, and epidemiological information. The expected result is Negative. Fact Sheet for Patients: SugarRoll.be Fact Sheet for Healthcare Providers: https://www.woods-mathews.com/ This test is not yet approved or cleared by the [place] FDA and  has been authorized for detection and/or diagnosis of SARS-CoV-2 by FDA under an Emergency Use Authorization (EUA). This EUA will remain  in effect (meaning this test can be used) for the duration of the COVID-19 declaration under Section 56 4(b)(1) of the Act, 21 U.S.C. section 360bbb-3(b)(1), unless the authorization is terminated or revoked sooner. Performed at [facility], [place] [address], [place], [place] [address]   Blood Culture (routine x 2)     Status: None (Preliminary result)   Collection Time: 12/13/18  7:43 PM   Specimen: BLOOD  Result Value Ref Range Status   Specimen Description BLOOD RIGHT ANTECUBITAL  Final   Special Requests   Final    BOTTLES DRAWN AEROBIC AND ANAEROBIC Blood Culture results may not be optimal due to an inadequate volume of blood received in culture bottles   Culture   Final    NO GROWTH 3 DAYS Performed at [facility], [place] [address], [place], [place] [address]    Report Status PENDING  Incomplete  Blood Culture (routine x 2)     Status: None (Preliminary result)   Collection Time: 12/13/18  7:46 PM    Specimen: BLOOD RIGHT HAND  Result Value Ref Range Status   Specimen Description BLOOD RIGHT HAND  Final   Special Requests   Final    BOTTLES DRAWN AEROBIC AND ANAEROBIC Blood Culture results may not be optimal due to an inadequate volume of blood received in culture bottles   Culture   Final    NO GROWTH 3 DAYS Performed at [facility], [place] [address], [place], [place] [address]    Report Status PENDING  Incomplete     Medications:   . albuterol  2 puff Inhalation Q6H  . enoxaparin (LOVENOX) injection  0.5 mg/kg Subcutaneous Q12H  . mouth rinse  15 mL Mouth Rinse BID  . methylPREDNISolone (SOLU-MEDROL) injection  60 mg Intravenous Q12H  . pantoprazole  40 mg Oral Daily  . phosphorus  250 mg Oral Daily  . pneumococcal 23 valent vaccine  0.5 mL Intramuscular Tomorrow-1000  . sodium chloride flush  3 mL Intravenous Once  . vitamin C  500 mg Oral Daily  . zinc sulfate  220 mg Oral Daily   Continuous Infusions: . remdesivir 100 mg in NS 250 mL Stopped (12/15/18 1500)      LOS: 3 days   Charlynne Cousins  [facility]  12/16/2018, 8:17 AM

## 2018-12-16 NOTE — Plan of Care (Signed)

## 2018-12-17 LAB — CBC WITH DIFFERENTIAL/PLATELET
Abs Immature Granulocytes: 0.42 10*3/uL — ABNORMAL HIGH (ref 0.00–0.07)
Basophils Absolute: 0.1 10*3/uL (ref 0.0–0.1)
Basophils Relative: 0 %
Eosinophils Absolute: 0 10*3/uL (ref 0.0–0.5)
Eosinophils Relative: 0 %
HCT: 37.9 % — ABNORMAL LOW (ref 39.0–52.0)
Hemoglobin: 12.7 g/dL — ABNORMAL LOW (ref 13.0–17.0)
Immature Granulocytes: 3 %
Lymphocytes Relative: 7 %
Lymphs Abs: 0.9 10*3/uL (ref 0.7–4.0)
MCH: 33 pg (ref 26.0–34.0)
MCHC: 33.5 g/dL (ref 30.0–36.0)
MCV: 98.4 fL (ref 80.0–100.0)
Monocytes Absolute: 1.1 10*3/uL — ABNORMAL HIGH (ref 0.1–1.0)
Monocytes Relative: 8 %
Neutro Abs: 10.4 10*3/uL — ABNORMAL HIGH (ref 1.7–7.7)
Neutrophils Relative %: 82 %
Platelets: 277 10*3/uL (ref 150–400)
RBC: 3.85 MIL/uL — ABNORMAL LOW (ref 4.22–5.81)
RDW: 14.1 % (ref 11.5–15.5)
WBC: 12.8 10*3/uL — ABNORMAL HIGH (ref 4.0–10.5)
nRBC: 0 % (ref 0.0–0.2)

## 2018-12-17 LAB — GLUCOSE, CAPILLARY
Glucose-Capillary: 119 mg/dL — ABNORMAL HIGH (ref 70–99)
Glucose-Capillary: 193 mg/dL — ABNORMAL HIGH (ref 70–99)
Glucose-Capillary: 154 mg/dL — ABNORMAL HIGH (ref 70–99)
Glucose-Capillary: 170 mg/dL — ABNORMAL HIGH (ref 70–99)

## 2018-12-17 LAB — COMPREHENSIVE METABOLIC PANEL
ALT: 55 U/L — ABNORMAL HIGH (ref 0–44)
AST: 47 U/L — ABNORMAL HIGH (ref 15–41)
Albumin: 2.8 g/dL — ABNORMAL LOW (ref 3.5–5.0)
Alkaline Phosphatase: 54 U/L (ref 38–126)
Anion gap: 13 (ref 5–15)
BUN: 36 mg/dL — ABNORMAL HIGH (ref 8–23)
CO2: 22 mmol/L (ref 22–32)
Calcium: 8.6 mg/dL — ABNORMAL LOW (ref 8.9–10.3)
Chloride: 104 mmol/L (ref 98–111)
Creatinine, Ser: 0.89 mg/dL (ref 0.61–1.24)
GFR calc Af Amer: >60 mL/min (ref >60)
GFR calc non Af Amer: >60 mL/min (ref >60)
Glucose, Bld: 111 mg/dL — ABNORMAL HIGH (ref 70–99)
Potassium: 3.9 mmol/L (ref 3.5–5.1)
Sodium: 139 mmol/L (ref 135–145)
Total Bilirubin: 0.4 mg/dL (ref 0.3–1.2)
Total Protein: 6 g/dL — ABNORMAL LOW (ref 6.5–8.1)

## 2018-12-17 LAB — D-DIMER, QUANTITATIVE: D-Dimer, Quant: 0.93 ug/mL-FEU — ABNORMAL HIGH (ref 0.00–0.50)

## 2018-12-17 LAB — PHOSPHORUS: Phosphorus: 2.6 mg/dL (ref 2.5–4.6)

## 2018-12-17 LAB — MAGNESIUM: Magnesium: 2.2 mg/dL (ref 1.7–2.4)

## 2018-12-17 LAB — FERRITIN: Ferritin: 411 ng/mL — ABNORMAL HIGH (ref 24–336)

## 2018-12-17 LAB — C-REACTIVE PROTEIN: CRP: 4.7 mg/dL — ABNORMAL HIGH (ref <1.0)

## 2018-12-17 NOTE — Progress Notes (Signed)
[facility] PROGRESS NOTE    Progress Note  Rashee Gladbach  [id number] DOB: 1950/11/17 DOA: 12/13/2018 PCP: Alroy Dust, L.Marlou Sa, MD     Brief Narrative:   Loral Demarchi is an 68 y.o. male past medical history of DVT, GERD who presents to the ED on 11/29 with progressive shortness of breath dry cough and fevers.  He was evaluated on 12/11/2018 for similar complaint tested negative for SARS-CoV-2, was given Decadron x1 in the ED and discharged home on azithromycin. Came back to the ED hypoxic and tested positive for SARS Covid 2 chest x-ray demonstrates interval development of bilateral peripheral infiltrates. Started on IV remdesivir and steroids and transferred to [facility].  Assessment/Plan:   Acute respiratory failure with hypoxia secondary to COVID-19 pneumonia: He still requiring 8 to 9 L of high flow nasal cannula to keep saturations greater than 90%. His inflammatory markers are significantly improved. Continue IV remdesivir and IV Decadron, continue vitamin C and zinc, he is status post Actemra on 12/14/2018. He appears to be euvolemic on physical exam. Try to keep the patient peripherally 16 hours a day and when not in bed out of bed to chair. He relates he could not tolerate proning but is laying on his side.  In a better mood than yesterday.  History of DVT: Continue current dose of enoxaparin.  Hypophosphatemia: Repleted orally now improved.  DVT prophylaxis: lovenox Family Communication:None Disposition Plan/Barrier to D/C: unable to determine Code Status:     Code Status Orders  (From admission, onward)         Start     Ordered   12/13/18 1951  Full code  Continuous     12/13/18 1954        Code Status History    This patient has a current code status but no historical code status.   Advance Care Planning Activity        IV Access:    Peripheral IV   Procedures and diagnostic studies:   No results found.   Medical  Consultants:    None.  Anti-Infectives:   IV remdesivir  Subjective:    [name] relates he had a good night sleep, he relates his breathing slightly better than yesterday.  Objective:    Vitals:   12/16/18 0839 12/16/18 1144 12/16/18 1943 12/17/18 0407  BP: 92/71 107/80 97/72 110/80  Pulse:  92 75 88  Resp:  16 18 20   Temp:  97.6 F (36.4 C) 97.8 F (36.6 C) 97.6 F (36.4 C)  TempSrc:  Oral Oral Oral  SpO2:   95% 91%  Weight:      Height:       SpO2: 91 % O2 Flow Rate (L/min): 9 L/min   Intake/Output Summary (Last 24 hours) at 12/17/2018 0826 Last data filed at 12/17/2018 0300 Gross per 24 hour  Intake 0 ml  Output 750 ml  Net -750 ml   Filed Weights   12/13/18 1425  Weight: 114.8 kg    Exam: General exam: In no acute distress. Respiratory system: Good air movement and diffuse crackles bilaterally. Cardiovascular system: S1 & S2 heard, RRR. No JVD. Gastrointestinal system: Abdomen is nondistended, soft and nontender.  Central nervous system: Alert and oriented. No focal neurological deficits. Extremities: No pedal edema. Skin: No rashes, lesions or ulcers Psychiatry: Judgement and insight appear normal. Mood & affect appropriate.   Data Reviewed:    Labs: Basic Metabolic Panel: Recent Labs  Lab 12/13/18 1433 12/13/18 1733 12/14/18 [id number]  12/15/18 0251 12/16/18 0010 12/17/18 0000  NA 140  --  139 140 141 139  K 3.1*  --  3.8 3.8 3.7 3.9  CL 106  --  110 109 106 104  CO2 22  --  19* 21* 22 22  GLUCOSE 111*  --  177* 150* 141* 111*  BUN 18  --  17 24* 33* 36*  CREATININE 1.16  --  1.02 0.87 1.02 0.89  CALCIUM 8.5*  --  8.0* 8.5* 8.7* 8.6*  MG  --  1.9 2.0 2.0 2.0 2.2  PHOS  --   --  1.8* 2.3* 2.9 2.6   GFR Estimated Creatinine Clearance: 105.4 mL/min (by C-G formula based on SCr of 0.89 mg/dL). Liver Function Tests: Recent Labs  Lab 12/13/18 1433 12/14/18 0212 12/15/18 0251 12/16/18 0010 12/17/18 0000  AST 37 33 25 33 47*  ALT  30 28 26  37 55*  ALKPHOS 54 50 48 54 54  BILITOT 1.0 0.9 0.3 0.3 0.4  PROT 6.7 6.5 6.5 6.4* 6.0*  ALBUMIN 3.0* 3.0* 2.7* 2.8* 2.8*   No results for input(s): LIPASE, AMYLASE in the last 168 hours. No results for input(s): AMMONIA in the last 168 hours. Coagulation profile Recent Labs  Lab 12/13/18 1753  INR 1.0   COVID-19 Labs  Recent Labs    12/15/18 0251 12/16/18 0010 12/17/18 0000  DDIMER 1.53* 1.17* 0.93*  FERRITIN 326 417* 411*  CRP 17.4* 9.0* 4.7*    Lab Results  Component Value Date   SARSCOV2NAA NEGATIVE 12/11/2018   [place] Not Detected 12/04/2018    CBC: Recent Labs  Lab 12/13/18 1433 12/14/18 0212 12/15/18 0251 12/16/18 0010 12/17/18 0000  WBC 11.6* 9.5 7.1 11.2* 12.8*  NEUTROABS 10.1* 8.7* 6.1 9.4* 10.4*  HGB 13.9 13.0 11.9* 11.9* 12.7*  HCT 41.0 38.0* 35.5* 35.5* 37.9*  MCV 99.3 98.4 97.0 98.1 98.4  PLT 180 178 212 263 277   Cardiac Enzymes: No results for input(s): CKTOTAL, CKMB, CKMBINDEX, TROPONINI in the last 168 hours. BNP (last 3 results) No results for input(s): PROBNP in the last 8760 hours. CBG: Recent Labs  Lab 12/16/18 0933 12/16/18 1149 12/16/18 1720 12/16/18 2128  GLUCAP 191* 259* 158* 167*   D-Dimer: Recent Labs    12/16/18 0010 12/17/18 0000  DDIMER 1.17* 0.93*   Hgb A1c: Recent Labs    12/16/18 0434  HGBA1C 5.9*   Lipid Profile: No results for input(s): CHOL, HDL, LDLCALC, TRIG, CHOLHDL, LDLDIRECT in the last 72 hours. Thyroid function studies: No results for input(s): TSH, T4TOTAL, T3FREE, THYROIDAB in the last 72 hours.  Invalid input(s): FREET3 Anemia work up: Recent Labs    12/16/18 0010 12/17/18 0000  FERRITIN 417* 411*   Sepsis Labs: Recent Labs  Lab 12/13/18 1753 12/13/18 1917 12/14/18 0212 12/15/18 0251 12/16/18 0010 12/17/18 0000  PROCALCITON 0.43  --   --   --   --   --   WBC  --   --  9.5 7.1 11.2* 12.8*  LATICACIDVEN 1.8 1.7  --   --   --   --    Microbiology Recent Results  (from the past 240 hour(s))  SARS CORONAVIRUS 2 (TAT 6-24 HRS) Nasopharyngeal Nasopharyngeal Swab     Status: None   Collection Time: 12/11/18  4:06 PM   Specimen: Nasopharyngeal Swab  Result Value Ref Range Status   SARS Coronavirus 2 NEGATIVE NEGATIVE Final    Comment: (NOTE) SARS-CoV-2 target nucleic acids are NOT DETECTED. The SARS-CoV-2 RNA  is generally detectable in upper and lower respiratory specimens during the acute phase of infection. Negative results do not preclude SARS-CoV-2 infection, do not rule out co-infections with other pathogens, and should not be used as the sole basis for treatment or other patient management decisions. Negative results must be combined with clinical observations, patient history, and epidemiological information. The expected result is Negative. Fact Sheet for Patients: SugarRoll.be Fact Sheet for Healthcare Providers: https://www.woods-mathews.com/ This test is not yet approved or cleared by the [place] FDA and  has been authorized for detection and/or diagnosis of SARS-CoV-2 by FDA under an Emergency Use Authorization (EUA). This EUA will remain  in effect (meaning this test can be used) for the duration of the COVID-19 declaration under Section 56 4(b)(1) of the Act, 21 U.S.C. section 360bbb-3(b)(1), unless the authorization is terminated or revoked sooner. Performed at [facility], [place] [address], [place], [place] [address]   Blood Culture (routine x 2)     Status: None (Preliminary result)   Collection Time: 12/13/18  7:43 PM   Specimen: BLOOD  Result Value Ref Range Status   Specimen Description BLOOD RIGHT ANTECUBITAL  Final   Special Requests   Final    BOTTLES DRAWN AEROBIC AND ANAEROBIC Blood Culture results may not be optimal due to an inadequate volume of blood received in culture bottles   Culture   Final    NO GROWTH 4 DAYS Performed at [facility], [place]  [address] [place], [place] [address]    Report Status PENDING  Incomplete  Blood Culture (routine x 2)     Status: None (Preliminary result)   Collection Time: 12/13/18  7:46 PM   Specimen: BLOOD RIGHT HAND  Result Value Ref Range Status   Specimen Description BLOOD RIGHT HAND  Final   Special Requests   Final    BOTTLES DRAWN AEROBIC AND ANAEROBIC Blood Culture results may not be optimal due to an inadequate volume of blood received in culture bottles   Culture   Final    NO GROWTH 4 DAYS Performed at [facility], [place] [address] [place], [place] [address]    Report Status PENDING  Incomplete     Medications:   . albuterol  2 puff Inhalation Q6H  . dexamethasone (DECADRON) injection  6 mg Intravenous Q12H  . enoxaparin (LOVENOX) injection  0.5 mg/kg Subcutaneous Q12H  . insulin aspart  0-15 Units Subcutaneous TID WC  . insulin aspart  0-5 Units Subcutaneous QHS  . insulin aspart  3 Units Subcutaneous TID WC  . insulin detemir  5 Units Subcutaneous QHS  . mouth rinse  15 mL Mouth Rinse BID  . pantoprazole  40 mg Oral Daily  . phosphorus  250 mg Oral Daily  . pneumococcal 23 valent vaccine  0.5 mL Intramuscular Tomorrow-1000  . sodium chloride flush  3 mL Intravenous Once  . vitamin C  500 mg Oral Daily  . zinc sulfate  220 mg Oral Daily   Continuous Infusions: . remdesivir 100 mg in NS 250 mL Stopped (12/16/18 1030)      LOS: 4 days   Charlynne Cousins  [facility]  12/17/2018, 8:26 AM

## 2018-12-18 LAB — COMPREHENSIVE METABOLIC PANEL
ALT: 62 U/L — ABNORMAL HIGH (ref 0–44)
AST: 41 U/L (ref 15–41)
Albumin: 2.9 g/dL — ABNORMAL LOW (ref 3.5–5.0)
Alkaline Phosphatase: 68 U/L (ref 38–126)
Anion gap: 12 (ref 5–15)
BUN: 41 mg/dL — ABNORMAL HIGH (ref 8–23)
CO2: 22 mmol/L (ref 22–32)
Calcium: 8.2 mg/dL — ABNORMAL LOW (ref 8.9–10.3)
Chloride: 106 mmol/L (ref 98–111)
Creatinine, Ser: 0.99 mg/dL (ref 0.61–1.24)
GFR calc Af Amer: >60 mL/min (ref >60)
GFR calc non Af Amer: >60 mL/min (ref >60)
Glucose, Bld: 127 mg/dL — ABNORMAL HIGH (ref 70–99)
Potassium: 4 mmol/L (ref 3.5–5.1)
Sodium: 140 mmol/L (ref 135–145)
Total Bilirubin: 0.8 mg/dL (ref 0.3–1.2)
Total Protein: 5.9 g/dL — ABNORMAL LOW (ref 6.5–8.1)

## 2018-12-18 LAB — CULTURE, BLOOD (ROUTINE X 2)
Culture: NO GROWTH
Culture: NO GROWTH

## 2018-12-18 LAB — FERRITIN: Ferritin: 330 ng/mL (ref 24–336)

## 2018-12-18 LAB — CBC WITH DIFFERENTIAL/PLATELET
Abs Immature Granulocytes: 1.07 10*3/uL — ABNORMAL HIGH (ref 0.00–0.07)
Basophils Absolute: 0.1 10*3/uL (ref 0.0–0.1)
Basophils Relative: 1 %
Eosinophils Absolute: 0 10*3/uL (ref 0.0–0.5)
Eosinophils Relative: 0 %
HCT: 37.5 % — ABNORMAL LOW (ref 39.0–52.0)
Hemoglobin: 12.8 g/dL — ABNORMAL LOW (ref 13.0–17.0)
Immature Granulocytes: 7 %
Lymphocytes Relative: 8 %
Lymphs Abs: 1.3 10*3/uL (ref 0.7–4.0)
MCH: 33.2 pg (ref 26.0–34.0)
MCHC: 34.1 g/dL (ref 30.0–36.0)
MCV: 97.2 fL (ref 80.0–100.0)
Monocytes Absolute: 1.4 10*3/uL — ABNORMAL HIGH (ref 0.1–1.0)
Monocytes Relative: 8 %
Neutro Abs: 12.4 10*3/uL — ABNORMAL HIGH (ref 1.7–7.7)
Neutrophils Relative %: 76 %
Platelets: 307 10*3/uL (ref 150–400)
RBC: 3.86 MIL/uL — ABNORMAL LOW (ref 4.22–5.81)
RDW: 13.8 % (ref 11.5–15.5)
WBC: 16.2 10*3/uL — ABNORMAL HIGH (ref 4.0–10.5)
nRBC: 0 % (ref 0.0–0.2)

## 2018-12-18 LAB — GLUCOSE, CAPILLARY
Glucose-Capillary: 130 mg/dL — ABNORMAL HIGH (ref 70–99)
Glucose-Capillary: 186 mg/dL — ABNORMAL HIGH (ref 70–99)
Glucose-Capillary: 132 mg/dL — ABNORMAL HIGH (ref 70–99)
Glucose-Capillary: 176 mg/dL — ABNORMAL HIGH (ref 70–99)

## 2018-12-18 LAB — C-REACTIVE PROTEIN: CRP: 3.3 mg/dL — ABNORMAL HIGH (ref <1.0)

## 2018-12-18 LAB — PHOSPHORUS: Phosphorus: 3 mg/dL (ref 2.5–4.6)

## 2018-12-18 LAB — D-DIMER, QUANTITATIVE: D-Dimer, Quant: 1.32 ug/mL-FEU — ABNORMAL HIGH (ref 0.00–0.50)

## 2018-12-18 LAB — MAGNESIUM: Magnesium: 2.3 mg/dL (ref 1.7–2.4)

## 2018-12-18 MED ORDER — FLUTICASONE PROPIONATE 50 MCG/ACT NA SUSP
2.0000 | Freq: Every day | NASAL | Status: DC
  Administered 2018-12-18 – 2018-12-20 (×3): 2 via NASAL
  Filled 2018-12-18: qty 16

## 2018-12-18 MED ORDER — ENOXAPARIN SODIUM 40 MG/0.4ML ~~LOC~~ SOLN
40.0000 mg | SUBCUTANEOUS | Status: DC
  Administered 2018-12-19 – 2018-12-20 (×2): 40 mg via SUBCUTANEOUS
  Filled 2018-12-18 (×2): qty 0.4

## 2018-12-18 NOTE — Progress Notes (Signed)
[facility] PROGRESS NOTE    Progress Note  Charles King  [id number] DOB: 03-Dec-1950 DOA: 12/13/2018 PCP: Charles King, Charles Sa, MD     Brief Narrative:   Charles King is an 68 y.o. male past medical history of DVT, GERD who presents to the ED on 11/29 with progressive shortness of breath dry cough and fevers.  He was evaluated on 12/11/2018 for similar complaint tested negative for SARS-CoV-2, was given Decadron x1 in the ED and discharged home on azithromycin. Came back to the ED hypoxic and tested positive for SARS Covid 2 chest x-ray demonstrates interval development of bilateral peripheral infiltrates. Started on IV remdesivir and steroids and transferred to [facility].  Assessment/Plan:   Acute respiratory failure with hypoxia secondary to COVID-19 pneumonia: He is now requiring 4 L of oxygen to keep saturation greater than 91%. Continue IV remdesivir and Decadron, vitamin C and zinc. He did receive Actemra on 12/14/2018. Inflammatory markers are significantly improved today. He relates he cannot tolerate proning.  History of DVT: Change enoxaparin  to prophylactic dose.  Hypophosphatemia: Repleted orally now improved.  DVT prophylaxis: lovenox Family Communication:None Disposition Plan/Barrier to D/C: unable to determine Code Status:     Code Status Orders  (From admission, onward)         Start     Ordered   12/13/18 1951  Full code  Continuous     12/13/18 1954        Code Status History    This patient has a current code status but no historical code status.   Advance Care Planning Activity        IV Access:    Peripheral IV   Procedures and diagnostic studies:   No results found.   Medical Consultants:    None.  Anti-Infectives:   IV remdesivir  Subjective:    Charles King in a better mood that yesterday had a good night sleep he relates he is breathing continues to improve.  Objective:    Vitals:   12/17/18 1437 12/17/18 2022 12/17/18 2354 12/18/18 0500  BP:  122/76 (!) 93/56 104/77  Pulse: 81 87 62 75  Resp:  18    Temp:  98.6 F (37 C) 97.7 F (36.5 C) (!) 97.5 F (36.4 C)  TempSrc:  Axillary Oral Oral  SpO2: 94% 91% 94% 95%  Weight:      Height:       SpO2: 95 % O2 Flow Rate (L/min): 4 L/min   Intake/Output Summary (Last 24 hours) at 12/18/2018 0749 Last data filed at 12/17/2018 1700 Gross per 24 hour  Intake -  Output 850 ml  Net -850 ml   Filed Weights   12/13/18 1425  Weight: 114.8 kg    Exam: General exam: In no acute distress. Respiratory system: Good air movement and diffuse crackles bilaterally. Cardiovascular system: S1 & S2 heard, RRR. No JVD. Gastrointestinal system: Abdomen is nondistended, soft and nontender.  Central nervous system: Alert and oriented. No focal neurological deficits. Extremities: No pedal edema. Skin: No rashes, lesions or ulcers Psychiatry: Judgement and insight appear normal. Mood & affect appropriate.    Data Reviewed:    Labs: Basic Metabolic Panel: Recent Labs  Lab 12/14/18 0212 12/15/18 0251 12/16/18 0010 12/17/18 0000 12/18/18 0008  NA 139 140 141 139 140  K 3.8 3.8 3.7 3.9 4.0  CL 110 109 106 104 106  CO2 19* 21* 22 22 22   GLUCOSE 177* 150* 141* 111* 127*  BUN 17 24*  33* 36* 41*  CREATININE 1.02 0.87 1.02 0.89 0.99  CALCIUM 8.0* 8.5* 8.7* 8.6* 8.2*  MG 2.0 2.0 2.0 2.2 2.3  PHOS 1.8* 2.3* 2.9 2.6 3.0   GFR Estimated Creatinine Clearance: 94.7 mL/min (by C-G formula based on SCr of 0.99 mg/dL). Liver Function Tests: Recent Labs  Lab 12/14/18 0212 12/15/18 0251 12/16/18 0010 12/17/18 0000 12/18/18 0008  AST 33 25 33 47* 41  ALT 28 26 37 55* 62*  ALKPHOS 50 48 54 54 68  BILITOT 0.9 0.3 0.3 0.4 0.8  PROT 6.5 6.5 6.4* 6.0* 5.9*  ALBUMIN 3.0* 2.7* 2.8* 2.8* 2.9*   No results for input(s): LIPASE, AMYLASE in the last 168 hours. No results for input(s): AMMONIA in the last 168 hours. Coagulation  profile Recent Labs  Lab 12/13/18 1753  INR 1.0   COVID-19 Labs  Recent Labs    12/16/18 0010 12/17/18 0000 12/18/18 0008  DDIMER 1.17* 0.93* 1.32*  FERRITIN 417* 411* 330  CRP 9.0* 4.7* 3.3*    Lab Results  Component Value Date   SARSCOV2NAA NEGATIVE 12/11/2018   [place] Not Detected 12/04/2018    CBC: Recent Labs  Lab 12/14/18 0212 12/15/18 0251 12/16/18 0010 12/17/18 0000 12/18/18 0008  WBC 9.5 7.1 11.2* 12.8* 16.2*  NEUTROABS 8.7* 6.1 9.4* 10.4* 12.4*  HGB 13.0 11.9* 11.9* 12.7* 12.8*  HCT 38.0* 35.5* 35.5* 37.9* 37.5*  MCV 98.4 97.0 98.1 98.4 97.2  PLT 178 212 263 277 307   Cardiac Enzymes: No results for input(s): CKTOTAL, CKMB, CKMBINDEX, TROPONINI in the last 168 hours. BNP (last 3 results) No results for input(s): PROBNP in the last 8760 hours. CBG: Recent Labs  Lab 12/16/18 2128 12/17/18 0811 12/17/18 1228 12/17/18 1719 12/17/18 2026  GLUCAP 167* 119* 193* 154* 170*   D-Dimer: Recent Labs    12/17/18 0000 12/18/18 0008  DDIMER 0.93* 1.32*   Hgb A1c: Recent Labs    12/16/18 0434  HGBA1C 5.9*   Lipid Profile: No results for input(s): CHOL, HDL, LDLCALC, TRIG, CHOLHDL, LDLDIRECT in the last 72 hours. Thyroid function studies: No results for input(s): TSH, T4TOTAL, T3FREE, THYROIDAB in the last 72 hours.  Invalid input(s): FREET3 Anemia work up: Recent Labs    12/17/18 0000 12/18/18 0008  FERRITIN 411* 330   Sepsis Labs: Recent Labs  Lab 12/13/18 1753 12/13/18 1917  12/15/18 0251 12/16/18 0010 12/17/18 0000 12/18/18 0008  PROCALCITON 0.43  --   --   --   --   --   --   WBC  --   --    < > 7.1 11.2* 12.8* 16.2*  LATICACIDVEN 1.8 1.7  --   --   --   --   --    < > = values in this interval not displayed.   Microbiology Recent Results (from the past 240 hour(s))  SARS CORONAVIRUS 2 (TAT 6-24 HRS) Nasopharyngeal Nasopharyngeal Swab     Status: None   Collection Time: 12/11/18  4:06 PM   Specimen: Nasopharyngeal  Swab  Result Value Ref Range Status   SARS Coronavirus 2 NEGATIVE NEGATIVE Final    Comment: (NOTE) SARS-CoV-2 target nucleic acids are NOT DETECTED. The SARS-CoV-2 RNA is generally detectable in upper and lower respiratory specimens during the acute phase of infection. Negative results do not preclude SARS-CoV-2 infection, do not rule out co-infections with other pathogens, and should not be used as the sole basis for treatment or other patient management decisions. Negative results must be combined with  clinical observations, patient history, and epidemiological information. The expected result is Negative. Fact Sheet for Patients: SugarRoll.be Fact Sheet for Healthcare Providers: https://www.woods-mathews.com/ This test is not yet approved or cleared by the [place] FDA and  has been authorized for detection and/or diagnosis of SARS-CoV-2 by FDA under an Emergency Use Authorization (EUA). This EUA will remain  in effect (meaning this test can be used) for the duration of the COVID-19 declaration under Section 56 4(b)(1) of the Act, 21 U.S.C. section 360bbb-3(b)(1), unless the authorization is terminated or revoked sooner. Performed at [facility], [place] [address], [place], [place] [address]   Blood Culture (routine x 2)     Status: None (Preliminary result)   Collection Time: 12/13/18  7:43 PM   Specimen: BLOOD  Result Value Ref Range Status   Specimen Description BLOOD RIGHT ANTECUBITAL  Final   Special Requests   Final    BOTTLES DRAWN AEROBIC AND ANAEROBIC Blood Culture results may not be optimal due to an inadequate volume of blood received in culture bottles   Culture   Final    NO GROWTH 4 DAYS Performed at [facility], [place] [address], [place], [place] [address]    Report Status PENDING  Incomplete  Blood Culture (routine x 2)     Status: None (Preliminary result)   Collection Time: 12/13/18  7:46 PM    Specimen: BLOOD RIGHT HAND  Result Value Ref Range Status   Specimen Description BLOOD RIGHT HAND  Final   Special Requests   Final    BOTTLES DRAWN AEROBIC AND ANAEROBIC Blood Culture results may not be optimal due to an inadequate volume of blood received in culture bottles   Culture   Final    NO GROWTH 4 DAYS Performed at [facility], [place] [address] [place], [place] [address]    Report Status PENDING  Incomplete     Medications:   . albuterol  2 puff Inhalation Q6H  . dexamethasone (DECADRON) injection  6 mg Intravenous Q12H  . enoxaparin (LOVENOX) injection  0.5 mg/kg Subcutaneous Q12H  . insulin aspart  0-15 Units Subcutaneous TID WC  . insulin aspart  0-5 Units Subcutaneous QHS  . insulin aspart  3 Units Subcutaneous TID WC  . insulin detemir  5 Units Subcutaneous QHS  . mouth rinse  15 mL Mouth Rinse BID  . pantoprazole  40 mg Oral Daily  . phosphorus  250 mg Oral Daily  . pneumococcal 23 valent vaccine  0.5 mL Intramuscular Tomorrow-1000  . sodium chloride flush  3 mL Intravenous Once  . vitamin C  500 mg Oral Daily  . zinc sulfate  220 mg Oral Daily   Continuous Infusions:     LOS: 5 days   Charlynne Cousins  [facility]  12/18/2018, 7:49 AM

## 2018-12-18 NOTE — Progress Notes (Signed)
RN called wife for updated, left a message.

## 2018-12-18 NOTE — Progress Notes (Signed)
ANTICOAGULATION CONSULT NOTE - Follow Up Consult  Pharmacy Consult for Lovenox Indication: VTE prophylaxis  No Known Allergies  Patient Measurements: Height: 6' (182.9 cm) Weight: 253 lb (114.8 kg) IBW/kg (Calculated) : 77.6 Heparin Dosing Weight:   Labs: Recent Labs    12/16/18 0010 12/17/18 0000 12/18/18 0008  HGB 11.9* 12.7* 12.8*  HCT 35.5* 37.9* 37.5*  PLT 263 277 307  CREATININE 1.02 0.89 0.99    Estimated Creatinine Clearance: 94.7 mL/min (by C-G formula based on SCr of 0.99 mg/dL).  Assessment: PHARMACY CONSULT: Lovenox for VTE prophylaxis  Currently on higher prophylactic Lovenox dose 0.5 mg/kg (55mg ) BID for elevated d-dimer (though d-dimer < 5) due to severe covid and h/o DVT.  Wt: 114 kg, BMI 34 Scr:  1, CrCl >30 ml/hr  CBC stable D-dimer 1.32  A/P:  Lovenox 40 mg [place] q24h Pharmacy to sign off notes.  Gretta Arab PharmD, BCPS Clinical pharmacist phone 7am- 5pm: [phone] 12/18/2018 11:59 AM

## 2018-12-18 NOTE — Evaluation (Signed)
Physical Therapy Evaluation Patient Details Name: Charles King MRN: [id number] DOB: [birth year] Today's Date: 12/18/2018   History of Present Illness  67 y.o. male w/ hx of  DVT, GERD, presents to the ED on 11/29 with progressive SOB, dry cough and fevers.  He was evaluated 12/11/2018 for similar complaint tested negative for SARS-CoV-2, was given Decadron x1 in the ED and d/c home on azithromycin. Came back to the ED hypoxic and tested positive for SARS Covid 2 chest x-ray demonstrates interval development of bilateral peripheral infiltrates. Started on IV remdesivir and steroids and transferred to [facility].  Clinical Impression   Pt admitted with above diagnosis. PTA living home with spouse and was very independent. Pt currently with functional limitations due to the deficits listed below (see PT Problem List). Currently at mod I with most mobility, was found standing at sink states had just finished brushing his teeth. He was on 1L/min via [place] and sats in 90s at time. With ambulation needed SBA/supervsion to complete 270ft, pt stayed on 1L/min and was able to maintain sats in 90-80s, lowest reading noted to be 81% briefly but pt was able to recover with pursed lip breathing. Pt initiated on incentive spirometer and also flutter valve exercises.  Pt will benefit from skilled PT tx while in hospital to increase his overall functional independence, activity tolerance and safety with mobility to allow discharge to the venue listed below.       Follow Up Recommendations No PT follow up    Equipment Recommendations  None recommended by PT    Recommendations for Other Services       Precautions / Restrictions Precautions Precautions: Fall Restrictions Weight Bearing Restrictions: No      Mobility  Bed Mobility Overal bed mobility: Modified Independent                Transfers Overall transfer level: Modified independent                  Ambulation/Gait Ambulation/Gait  assistance: Supervision Gait Distance (Feet): 200 Feet Assistive device: None Gait Pattern/deviations: Step-through pattern Gait velocity: fair   General Gait Details: able to ambulate distance, noted some over pronation on BLE, pt on 4L/min via [place] desat to min 81% able to recover fairly quickly with cues for pursed lip breathing  Stairs            Wheelchair Mobility    Modified Rankin (Stroke Patients Only)       Balance Overall balance assessment: Modified Independent                                           Pertinent Vitals/Pain      Home Living Family/patient expects to be discharged to:: Private residence Living Arrangements: Spouse/significant other Available Help at Discharge: Family Type of Home: House Home Access: Stairs to enter   Technical brewer of Steps: 2 Home Layout: One level Home Equipment: Cane - single point      Prior Function Level of Independence: Independent               Hand Dominance   Dominant Hand: Right    Extremity/Trunk Assessment   Upper Extremity Assessment Upper Extremity Assessment: Overall WFL for tasks assessed    Lower Extremity Assessment Lower Extremity Assessment: Overall WFL for tasks assessed    Cervical / Trunk Assessment Cervical / Trunk Assessment:  Normal  Communication   Communication: No difficulties  Cognition Arousal/Alertness: Awake/alert Behavior During Therapy: WFL for tasks assessed/performed Overall Cognitive Status: Within Functional Limits for tasks assessed                                        General Comments      Exercises Other Exercises Other Exercises: incentive spirometer x 10 pulls 1275ml Other Exercises: flutter valve x 10    Assessment/Plan    PT Assessment Patient needs continued PT services  PT Problem List Decreased activity tolerance       PT Treatment Interventions Gait training;Functional mobility  training;Therapeutic activities;Therapeutic exercise;Neuromuscular re-education    PT Goals (Current goals can be found in the Care Plan section)  Acute Rehab PT Goals Patient Stated Goal: go home PT Goal Formulation: With patient Time For Goal Achievement: 01/01/19 Potential to Achieve Goals: Good    Frequency Min 3X/week   Barriers to discharge        Co-evaluation               AM-PAC PT "6 Clicks" Mobility  Outcome Measure Help needed turning from your back to your side while in a flat bed without using bedrails?: None Help needed moving from lying on your back to sitting on the side of a flat bed without using bedrails?: None Help needed moving to and from a bed to a chair (including a wheelchair)?: None Help needed standing up from a chair using your arms (e.g., wheelchair or bedside chair)?: None Help needed to walk in hospital room?: A Little Help needed climbing 3-5 steps with a railing? : A Little 6 Click Score: 22    End of Session Equipment Utilized During Treatment: Oxygen Activity Tolerance: Patient tolerated treatment well Patient left: in chair;with call bell/phone within reach Nurse Communication: Mobility status PT Visit Diagnosis: Other abnormalities of gait and mobility (R26.89)    Time: [id number] PT Time Calculation (min) (ACUTE ONLY): 23 min   Charges:   PT Evaluation $PT Eval Moderate Complexity: 1 Mod PT Treatments $Gait Training: 8-22 mins        Horald Chestnut, PT   Delford Field 12/18/2018, 1:46 PM

## 2018-12-19 LAB — GLUCOSE, CAPILLARY
Glucose-Capillary: 146 mg/dL — ABNORMAL HIGH (ref 70–99)
Glucose-Capillary: 126 mg/dL — ABNORMAL HIGH (ref 70–99)
Glucose-Capillary: 180 mg/dL — ABNORMAL HIGH (ref 70–99)
Glucose-Capillary: 102 mg/dL — ABNORMAL HIGH (ref 70–99)

## 2018-12-19 NOTE — Progress Notes (Signed)
[facility] PROGRESS NOTE    Progress Note  Charles King  [id number] DOB: 02-20-1950 DOA: 12/13/2018 PCP: Alroy Dust, L.Marlou Sa, MD     Brief Narrative:   Charles King is an 68 y.o. male past medical history of DVT, GERD who presents to the ED on 11/29 with progressive shortness of breath dry cough and fevers.  He was evaluated on 12/11/2018 for similar complaint tested negative for SARS-CoV-2, was given Decadron x1 in the ED and discharged home on azithromycin. Came back to the ED hypoxic and tested positive for SARS Covid 2 chest x-ray demonstrates interval development of bilateral peripheral infiltrates. Started on IV remdesivir and steroids and transferred to [facility]  Assessment/Plan:   Acute respiratory failure with hypoxia secondary to COVID-19 pneumonia: He is now requiring 3 L of oxygen keep saturations greater than 94%. Continue IV remdesivir and steroids, vitamin C and zinc. He did receive Actemra on 12/14/2018. His inflammatory markers are drastically improved. Try to keep the patient prone for at least 16 hours a day, if not in the prone position out of bed to chair.  History of DVT: Change enoxaparin  to prophylactic dose.  Hypophosphatemia: Repleted orally now improved.  DVT prophylaxis: lovenox Family Communication:None Disposition Plan/Barrier to D/C: unable to determine Code Status:     Code Status Orders  (From admission, onward)         Start     Ordered   12/13/18 1951  Full code  Continuous     12/13/18 1954        Code Status History    This patient has a current code status but no historical code status.   Advance Care Planning Activity        IV Access:    Peripheral IV   Procedures and diagnostic studies:   No results found.   Medical Consultants:    None.  Anti-Infectives:   IV remdesivir  Subjective:    Charles King Half he relates his breathing continues to improve.  Objective:    Vitals:    12/18/18 2009 12/19/18 0021 12/19/18 0404 12/19/18 0743  BP:  109/82 96/80 109/66  Pulse: 94 83 72 83  Resp:    13  Temp:  98.5 F (36.9 C) 98.1 F (36.7 C) 98 F (36.7 C)  TempSrc:  Oral Oral Oral  SpO2: 97% 94% (!) 86% 96%  Weight:      Height:       SpO2: 96 % O2 Flow Rate (L/min): 3 L/min   Intake/Output Summary (Last 24 hours) at 12/19/2018 0800 Last data filed at 12/18/2018 1814 Gross per 24 hour  Intake 240 ml  Output 380 ml  Net -140 ml   Filed Weights   12/13/18 1425  Weight: 114.8 kg    Exam: General exam: In no acute distress. Respiratory system: Good air movement and diffuse crackles bilaterally. Cardiovascular system: S1 & S2 heard, RRR. No JVD. Gastrointestinal system: Abdomen is nondistended, soft and nontender.  Central nervous system: Alert and oriented. No focal neurological deficits. Extremities: No pedal edema. Skin: No rashes, lesions or ulcers Psychiatry: Judgement and insight appear normal. Mood & affect appropriate.    Data Reviewed:    Labs: Basic Metabolic Panel: Recent Labs  Lab 12/14/18 0212 12/15/18 0251 12/16/18 0010 12/17/18 0000 12/18/18 0008  NA 139 140 141 139 140  K 3.8 3.8 3.7 3.9 4.0  CL 110 109 106 104 106  CO2 19* 21* 22 22 22   GLUCOSE 177* 150* 141*  111* 127*  BUN 17 24* 33* 36* 41*  CREATININE 1.02 0.87 1.02 0.89 0.99  CALCIUM 8.0* 8.5* 8.7* 8.6* 8.2*  MG 2.0 2.0 2.0 2.2 2.3  PHOS 1.8* 2.3* 2.9 2.6 3.0   GFR Estimated Creatinine Clearance: 94.7 mL/min (by C-G formula based on SCr of 0.99 mg/dL). Liver Function Tests: Recent Labs  Lab 12/14/18 0212 12/15/18 0251 12/16/18 0010 12/17/18 0000 12/18/18 0008  AST 33 25 33 47* 41  ALT 28 26 37 55* 62*  ALKPHOS 50 48 54 54 68  BILITOT 0.9 0.3 0.3 0.4 0.8  PROT 6.5 6.5 6.4* 6.0* 5.9*  ALBUMIN 3.0* 2.7* 2.8* 2.8* 2.9*   No results for input(s): LIPASE, AMYLASE in the last 168 hours. No results for input(s): AMMONIA in the last 168 hours. Coagulation  profile Recent Labs  Lab 12/13/18 1753  INR 1.0   COVID-19 Labs  Recent Labs    12/17/18 0000 12/18/18 0008  DDIMER 0.93* 1.32*  FERRITIN 411* 330  CRP 4.7* 3.3*    Lab Results  Component Value Date   SARSCOV2NAA NEGATIVE 12/11/2018   [place] Not Detected 12/04/2018    CBC: Recent Labs  Lab 12/14/18 0212 12/15/18 0251 12/16/18 0010 12/17/18 0000 12/18/18 0008  WBC 9.5 7.1 11.2* 12.8* 16.2*  NEUTROABS 8.7* 6.1 9.4* 10.4* 12.4*  HGB 13.0 11.9* 11.9* 12.7* 12.8*  HCT 38.0* 35.5* 35.5* 37.9* 37.5*  MCV 98.4 97.0 98.1 98.4 97.2  PLT 178 212 263 277 307   Cardiac Enzymes: No results for input(s): CKTOTAL, CKMB, CKMBINDEX, TROPONINI in the last 168 hours. BNP (last 3 results) No results for input(s): PROBNP in the last 8760 hours. CBG: Recent Labs  Lab 12/18/18 0847 12/18/18 1141 12/18/18 1659 12/18/18 1959 12/19/18 0020  GLUCAP 130* 186* 132* 176* 146*   D-Dimer: Recent Labs    12/17/18 0000 12/18/18 0008  DDIMER 0.93* 1.32*   Hgb A1c: No results for input(s): HGBA1C in the last 72 hours. Lipid Profile: No results for input(s): CHOL, HDL, LDLCALC, TRIG, CHOLHDL, LDLDIRECT in the last 72 hours. Thyroid function studies: No results for input(s): TSH, T4TOTAL, T3FREE, THYROIDAB in the last 72 hours.  Invalid input(s): FREET3 Anemia work up: Recent Labs    12/17/18 0000 12/18/18 0008  FERRITIN 411* 330   Sepsis Labs: Recent Labs  Lab 12/13/18 1753 12/13/18 1917  12/15/18 0251 12/16/18 0010 12/17/18 0000 12/18/18 0008  PROCALCITON 0.43  --   --   --   --   --   --   WBC  --   --    < > 7.1 11.2* 12.8* 16.2*  LATICACIDVEN 1.8 1.7  --   --   --   --   --    < > = values in this interval not displayed.   Microbiology Recent Results (from the past 240 hour(s))  SARS CORONAVIRUS 2 (TAT 6-24 HRS) Nasopharyngeal Nasopharyngeal Swab     Status: None   Collection Time: 12/11/18  4:06 PM   Specimen: Nasopharyngeal Swab  Result Value Ref  Range Status   SARS Coronavirus 2 NEGATIVE NEGATIVE Final    Comment: (NOTE) SARS-CoV-2 target nucleic acids are NOT DETECTED. The SARS-CoV-2 RNA is generally detectable in upper and lower respiratory specimens during the acute phase of infection. Negative results do not preclude SARS-CoV-2 infection, do not rule out co-infections with other pathogens, and should not be used as the sole basis for treatment or other patient management decisions. Negative results must be combined with clinical  observations, patient history, and epidemiological information. The expected result is Negative. Fact Sheet for Patients: SugarRoll.be Fact Sheet for Healthcare Providers: https://www.woods-mathews.com/ This test is not yet approved or cleared by the [place] FDA and  has been authorized for detection and/or diagnosis of SARS-CoV-2 by FDA under an Emergency Use Authorization (EUA). This EUA will remain  in effect (meaning this test can be used) for the duration of the COVID-19 declaration under Section 56 4(b)(1) of the Act, 21 U.S.C. section 360bbb-3(b)(1), unless the authorization is terminated or revoked sooner. Performed at [facility], [place] [address], [place], [place] [address]   Blood Culture (routine x 2)     Status: None   Collection Time: 12/13/18  7:43 PM   Specimen: BLOOD  Result Value Ref Range Status   Specimen Description BLOOD RIGHT ANTECUBITAL  Final   Special Requests   Final    BOTTLES DRAWN AEROBIC AND ANAEROBIC Blood Culture results may not be optimal due to an inadequate volume of blood received in culture bottles   Culture   Final    NO GROWTH 5 DAYS Performed at [facility], [place] [address], [place], [place] [address]    Report Status 12/18/2018 FINAL  Final  Blood Culture (routine x 2)     Status: None   Collection Time: 12/13/18  7:46 PM   Specimen: BLOOD RIGHT HAND  Result Value Ref Range Status    Specimen Description BLOOD RIGHT HAND  Final   Special Requests   Final    BOTTLES DRAWN AEROBIC AND ANAEROBIC Blood Culture results may not be optimal due to an inadequate volume of blood received in culture bottles   Culture   Final    NO GROWTH 5 DAYS Performed at [facility], [place] [address], [place], [place] [address]    Report Status 12/18/2018 FINAL  Final     Medications:   . albuterol  2 puff Inhalation Q6H  . dexamethasone (DECADRON) injection  6 mg Intravenous Q12H  . enoxaparin (LOVENOX) injection  40 mg Subcutaneous Q24H  . fluticasone  2 spray Each Nare Daily  . insulin aspart  0-15 Units Subcutaneous TID WC  . insulin aspart  0-5 Units Subcutaneous QHS  . insulin aspart  3 Units Subcutaneous TID WC  . insulin detemir  5 Units Subcutaneous QHS  . mouth rinse  15 mL Mouth Rinse BID  . pantoprazole  40 mg Oral Daily  . phosphorus  250 mg Oral Daily  . pneumococcal 23 valent vaccine  0.5 mL Intramuscular Tomorrow-1000  . sodium chloride flush  3 mL Intravenous Once  . vitamin C  500 mg Oral Daily  . zinc sulfate  220 mg Oral Daily   Continuous Infusions:     LOS: 6 days   Charlynne Cousins  [facility]  12/19/2018, 8:00 AM

## 2018-12-19 NOTE — Progress Notes (Signed)
Wife called RN back. She is autoimmune compromised and had a lot of questions about patient being discharged home. Instructions and support given.

## 2018-12-19 NOTE — Progress Notes (Signed)
SATURATION QUALIFICATIONS: (This note is used to comply with regulatory documentation for home oxygen)  Patient Saturations on Room Air at Rest = 93%  Patient Saturations on Room Air while Ambulating 87%  Patient Saturations on 3 Liters of oxygen while Ambulating = 89%  Please briefly explain why patient needs home oxygen:sob with ambulating

## 2018-12-19 NOTE — Progress Notes (Signed)
RN called wife for a daily  Update. Left a message at her voicemail

## 2018-12-20 DIAGNOSIS — E876 Hypokalemia: Secondary | ICD-10-CM

## 2018-12-20 LAB — GLUCOSE, CAPILLARY
Glucose-Capillary: 118 mg/dL — ABNORMAL HIGH (ref 70–99)
Glucose-Capillary: 149 mg/dL — ABNORMAL HIGH (ref 70–99)

## 2018-12-20 MED ORDER — DEXAMETHASONE 6 MG PO TABS: 6.0000 mg | ORAL_TABLET | Freq: Every day | ORAL | 0 refills | Status: AC

## 2018-12-20 MED ORDER — DEXAMETHASONE 6 MG PO TABS
6.0000 mg | ORAL_TABLET | Freq: Every day | ORAL | Status: DC
  Administered 2018-12-20: 6 mg via ORAL
  Filled 2018-12-20: qty 1

## 2018-12-20 NOTE — Progress Notes (Signed)
Discharge instructions given to pt. Oxygen has been delivered at his house and here at the hospital. Pt. Stable and discharged with wife.

## 2018-12-20 NOTE — TOC Transition Note (Addendum)
Transition of Care [facility]) - CM/SW Discharge Note   Patient Details  Name: Charles King MRN: [id number] Date of Birth: [birth year]  Transition of [facility]) CM/SW Contact:  Maryclare Labrador, RN Phone Number: 12/20/2018, 10:54 AM   Clinical Narrative:   Pt to discharge home today.  Pt will travel home via private vehicle.  Pt will set up PCP follow up appt tomorrow.  Pt in agreement with home oxygen as ordered.  CM offered pt choice and he chose [facility] - agency contacted and referral accepted.  CM requested [facility] to deliver portable oxygen tank and pulse ox to pt. [employer] to follow back up with CM once equipment has been scheduled to arrive at the  home - bedside nurse aware that pt will need to remain in house until home equipment is confirmed in the home.    Update:  [employer] informed CM that home oxygen equipment has been delivered to the home - bedside nurse informed.   Final next level of care: Home/Self Care Barriers to Discharge: Barriers Resolved   Patient Goals and CMS Choice   CMS Medicare.gov Compare Post Acute Care list provided to:: Patient Choice offered to / list presented to : Patient  Discharge Placement                       Discharge Plan and Services                DME Arranged: Oxygen DME Agency: [place] Date DME Agency Contacted: 12/20/18 Time DME Agency Contacted: 1054 Representative spoke with at DME Agency: Magda Paganini            Social Determinants of Health ([place]) Interventions     Readmission Risk Interventions No flowsheet data found.

## 2018-12-20 NOTE — Discharge Summary (Signed)
Physician Discharge Summary  Charles King [id number] DOB: 05-May-1950 DOA: 12/13/2018  PCP: Alroy Dust, L.Marlou Sa, MD  Admit date: 12/13/2018 Discharge date: 12/20/2018  Admitted From: home Disposition:  Home  Recommendations for Outpatient Follow-up:  1. Follow up with PCP in 1-2 weeks 2. Please obtain BMP/CBC in one week   Home Health:Yes Equipment/Devices:Oxygen  Discharge Condition:Stable CODE STATUS:Full Diet recommendation: Heart Healthy  Brief/Interim Summary: 68 y.o. male past medical history of DVT, GERD who presents to the ED on 11/29 with progressive shortness of breath dry cough and fevers.  He was evaluated on 12/11/2018 for similar complaint tested negative for SARS-CoV-2, was given Decadron x1 in the ED and discharged home on azithromycin. Came back to the ED hypoxic and tested positive for SARS Covid 2 chest x-ray demonstrates interval development of bilateral peripheral infiltrates. Started on IV remdesivir and steroids and transferred to [facility].  Discharge Diagnoses:  Principal Problem:   Acute respiratory failure with hypoxia ([place]) Active Problems:   Hypokalemia   GERD (gastroesophageal reflux disease)   Pneumonia due to COVID-19 virus  Acute respiratory failure with hypoxia secondary to COVID-19 pneumonia: He was started on supplemental oxygen and saturations improved to greater than 94%. He was started on IV remdesivir, steroids, vitamin C and zinc. He did receive 1 dose of Actemra on 12/14/2018. His inflammatory markers improved drastically, he was weaned off to room air, he was ambulated and saturations drop so he was sent home on oxygen. He did not tolerate proning.  History of DVT: He was treated with prophylactic dose of Lovenox. Hypophosphatemia: Replete orally now resolved.  Discharge Instructions  Discharge Instructions    Diet - low sodium heart healthy   Complete by: As directed    Increase activity slowly   Complete by:  As directed      Allergies as of 12/20/2018   No Known Allergies     Medication List    TAKE these medications   acetaminophen 325 MG tablet Commonly known as: TYLENOL Take 650 mg by mouth every 6 (six) hours as needed.   dexamethasone 6 MG tablet Commonly known as: DECADRON Take 1 tablet (6 mg total) by mouth daily for 3 days.   NexIUM 20 MG capsule Generic drug: esomeprazole Take 20 mg by mouth daily at 12 noon.            Durable Medical Equipment  (From admission, onward)         Start     Ordered   12/20/18 1044  DME Oxygen  Once    Question Answer Comment  Length of Need 6 Months   Mode or (Route) Nasal cannula   Liters per Minute 2   Frequency Continuous (stationary and portable oxygen unit needed)   Oxygen conserving device Yes   Oxygen delivery system Gas      12/20/18 1044          No Known Allergies  Consultations:  None   Procedures/Studies: Dg Chest Portable 1 View  Result Date: 12/13/2018 CLINICAL DATA:  68 year old male with generalized weakness and dry cough. COVID 19 exposure. EXAM: PORTABLE CHEST 1 VIEW COMPARISON:  Chest radiograph dated 12/11/2018. FINDINGS: There is shallow inspiration. Bilateral predominantly peripheral and subpleural hazy densities, left greater right most consistent with developing infiltrate, likely atypical or viral in etiology. Overall progression of airspace densities compared to the prior radiograph. There is no pleural effusion or pneumothorax. Stable cardiac silhouette. No acute osseous pathology. IMPRESSION: Bilateral peripheral and subpleural hazy densities  most consistent with developing infiltrate, likely atypical or viral in etiology. Follow-up recommended. Electronically Signed   By: Anner Crete M.D.   On: 12/13/2018 15:08   Dg Chest Portable 1 View  Result Date: 12/11/2018 CLINICAL DATA:  Generalized weakness, dry cough, shortness of breath, fever, worse with 3 people who tested positive for  COVID last week but his test this past Monday was negative EXAM: PORTABLE CHEST 1 VIEW COMPARISON:  Portable exam 1539 hours without priors for comparison FINDINGS: Upper normal heart size. Mediastinal contours and pulmonary vascularity normal. Question mild patchy infiltrate LEFT mid lung. Remaining lungs clear. No pleural effusion or pneumothorax. No acute osseous findings. IMPRESSION: Question mild patchy infiltrate at LEFT mid lung. Electronically Signed   By: Lavonia Dana M.D.   On: 12/11/2018 16:05     Subjective: No complaints feels great.  Discharge Exam: Vitals:   12/20/18 0403 12/20/18 0800  BP: 101/71 95/71  Pulse: 61 62  Resp: 18 17  Temp: 98.3 F (36.8 C) (!) 97 F (36.1 C)  SpO2: 96% 96%   Vitals:   12/20/18 0020 12/20/18 0039 12/20/18 0403 12/20/18 0800  BP:  110/74 101/71 95/71  Pulse: 94 87 61 62  Resp:   18 17  Temp:   98.3 F (36.8 C) (!) 97 F (36.1 C)  TempSrc:   Oral Oral  SpO2: 93% 96% 96% 96%  Weight:      Height:        General: Pt is alert, awake, not in acute distress Cardiovascular: RRR, S1/S2 +, no rubs, no gallops Respiratory: CTA bilaterally, no wheezing, no rhonchi Abdominal: Soft, NT, ND, bowel sounds + Extremities: no edema, no cyanosis    The results of significant diagnostics from this hospitalization (including imaging, microbiology, ancillary and laboratory) are listed below for reference.     Microbiology: Recent Results (from the past 240 hour(s))  SARS CORONAVIRUS 2 (TAT 6-24 HRS) Nasopharyngeal Nasopharyngeal Swab     Status: None   Collection Time: 12/11/18  4:06 PM   Specimen: Nasopharyngeal Swab  Result Value Ref Range Status   SARS Coronavirus 2 NEGATIVE NEGATIVE Final    Comment: (NOTE) SARS-CoV-2 target nucleic acids are NOT DETECTED. The SARS-CoV-2 RNA is generally detectable in upper and lower respiratory specimens during the acute phase of infection. Negative results do not preclude SARS-CoV-2 infection, do not  rule out co-infections with other pathogens, and should not be used as the sole basis for treatment or other patient management decisions. Negative results must be combined with clinical observations, patient history, and epidemiological information. The expected result is Negative. Fact Sheet for Patients: SugarRoll.be Fact Sheet for Healthcare Providers: https://www.woods-mathews.com/ This test is not yet approved or cleared by the [place] FDA and  has been authorized for detection and/or diagnosis of SARS-CoV-2 by FDA under an Emergency Use Authorization (EUA). This EUA will remain  in effect (meaning this test can be used) for the duration of the COVID-19 declaration under Section 56 4(b)(1) of the Act, 21 U.S.C. section 360bbb-3(b)(1), unless the authorization is terminated or revoked sooner. Performed at [facility], [place] [address] [place], [place] [address]   Blood Culture (routine x 2)     Status: None   Collection Time: 12/13/18  7:43 PM   Specimen: BLOOD  Result Value Ref Range Status   Specimen Description BLOOD RIGHT ANTECUBITAL  Final   Special Requests   Final    BOTTLES DRAWN AEROBIC AND ANAEROBIC Blood Culture results  may not be optimal due to an inadequate volume of blood received in culture bottles   Culture   Final    NO GROWTH 5 DAYS Performed at [facility], [place] [address], [place], [place] [address]    Report Status 12/18/2018 FINAL  Final  Blood Culture (routine x 2)     Status: None   Collection Time: 12/13/18  7:46 PM   Specimen: BLOOD RIGHT HAND  Result Value Ref Range Status   Specimen Description BLOOD RIGHT HAND  Final   Special Requests   Final    BOTTLES DRAWN AEROBIC AND ANAEROBIC Blood Culture results may not be optimal due to an inadequate volume of blood received in culture bottles   Culture   Final    NO GROWTH 5 DAYS Performed at [facility], [place] [address],  [place], [place] [address]    Report Status 12/18/2018 FINAL  Final     Labs: BNP (last 3 results) No results for input(s): BNP in the last 8760 hours. Basic Metabolic Panel: Recent Labs  Lab 12/14/18 0212 12/15/18 0251 12/16/18 0010 12/17/18 0000 12/18/18 0008  NA 139 140 141 139 140  K 3.8 3.8 3.7 3.9 4.0  CL 110 109 106 104 106  CO2 19* 21* 22 22 22   GLUCOSE 177* 150* 141* 111* 127*  BUN 17 24* 33* 36* 41*  CREATININE 1.02 0.87 1.02 0.89 0.99  CALCIUM 8.0* 8.5* 8.7* 8.6* 8.2*  MG 2.0 2.0 2.0 2.2 2.3  PHOS 1.8* 2.3* 2.9 2.6 3.0   Liver Function Tests: Recent Labs  Lab 12/14/18 0212 12/15/18 0251 12/16/18 0010 12/17/18 0000 12/18/18 0008  AST 33 25 33 47* 41  ALT 28 26 37 55* 62*  ALKPHOS 50 48 54 54 68  BILITOT 0.9 0.3 0.3 0.4 0.8  PROT 6.5 6.5 6.4* 6.0* 5.9*  ALBUMIN 3.0* 2.7* 2.8* 2.8* 2.9*   No results for input(s): LIPASE, AMYLASE in the last 168 hours. No results for input(s): AMMONIA in the last 168 hours. CBC: Recent Labs  Lab 12/14/18 0212 12/15/18 0251 12/16/18 0010 12/17/18 0000 12/18/18 0008  WBC 9.5 7.1 11.2* 12.8* 16.2*  NEUTROABS 8.7* 6.1 9.4* 10.4* 12.4*  HGB 13.0 11.9* 11.9* 12.7* 12.8*  HCT 38.0* 35.5* 35.5* 37.9* 37.5*  MCV 98.4 97.0 98.1 98.4 97.2  PLT 178 212 263 277 307   Cardiac Enzymes: No results for input(s): CKTOTAL, CKMB, CKMBINDEX, TROPONINI in the last 168 hours. BNP: Invalid input(s): POCBNP CBG: Recent Labs  Lab 12/19/18 0020 12/19/18 0839 12/19/18 1156 12/19/18 1700 12/20/18 0832  GLUCAP 146* 126* 180* 102* 118*   D-Dimer Recent Labs    12/18/18 0008  DDIMER 1.32*   Hgb A1c No results for input(s): HGBA1C in the last 72 hours. Lipid Profile No results for input(s): CHOL, HDL, LDLCALC, TRIG, CHOLHDL, LDLDIRECT in the last 72 hours. Thyroid function studies No results for input(s): TSH, T4TOTAL, T3FREE, THYROIDAB in the last 72 hours.  Invalid input(s): FREET3 Anemia work up [employer]     12/18/18 0008  FERRITIN 330   Urinalysis No results found for: COLORURINE, APPEARANCEUR, [place], [place], [place], [place], [place], [place], PROTEINUR, UROBILINOGEN, NITRITE, LEUKOCYTESUR Sepsis Labs Invalid input(s): PROCALCITONIN,  WBC,  LACTICIDVEN Microbiology Recent Results (from the past 240 hour(s))  SARS CORONAVIRUS 2 (TAT 6-24 HRS) Nasopharyngeal Nasopharyngeal Swab     Status: None   Collection Time: 12/11/18  4:06 PM   Specimen: Nasopharyngeal Swab  Result Value Ref Range Status  SARS Coronavirus 2 NEGATIVE NEGATIVE Final    Comment: (NOTE) SARS-CoV-2 target nucleic acids are NOT DETECTED. The SARS-CoV-2 RNA is generally detectable in upper and lower respiratory specimens during the acute phase of infection. Negative results do not preclude SARS-CoV-2 infection, do not rule out co-infections with other pathogens, and should not be used as the sole basis for treatment or other patient management decisions. Negative results must be combined with clinical observations, patient history, and epidemiological information. The expected result is Negative. Fact Sheet for Patients: SugarRoll.be Fact Sheet for Healthcare Providers: https://www.woods-mathews.com/ This test is not yet approved or cleared by the [place] FDA and  has been authorized for detection and/or diagnosis of SARS-CoV-2 by FDA under an Emergency Use Authorization (EUA). This EUA will remain  in effect (meaning this test can be used) for the duration of the COVID-19 declaration under Section 56 4(b)(1) of the Act, 21 U.S.C. section 360bbb-3(b)(1), unless the authorization is terminated or revoked sooner. Performed at [facility], [place] [address], [place], [place] [address]   Blood Culture (routine x 2)     Status: None   Collection Time: 12/13/18  7:43 PM   Specimen: BLOOD  Result Value Ref Range Status   Specimen Description BLOOD RIGHT  ANTECUBITAL  Final   Special Requests   Final    BOTTLES DRAWN AEROBIC AND ANAEROBIC Blood Culture results may not be optimal due to an inadequate volume of blood received in culture bottles   Culture   Final    NO GROWTH 5 DAYS Performed at [facility], [place] [address] [place], [place] [address]    Report Status 12/18/2018 FINAL  Final  Blood Culture (routine x 2)     Status: None   Collection Time: 12/13/18  7:46 PM   Specimen: BLOOD RIGHT HAND  Result Value Ref Range Status   Specimen Description BLOOD RIGHT HAND  Final   Special Requests   Final    BOTTLES DRAWN AEROBIC AND ANAEROBIC Blood Culture results may not be optimal due to an inadequate volume of blood received in culture bottles   Culture   Final    NO GROWTH 5 DAYS Performed at [facility], [place] [address] [place], [place] [address]    Report Status 12/18/2018 FINAL  Final     Time coordinating discharge: Over 40 minutes  SIGNED:   Charlynne Cousins, MD  [facility] 12/20/2018, 10:44 AM Pager   If 7PM-7AM, please contact night-coverage [email or web address] Password [email or web address]

## 2018-12-20 NOTE — Progress Notes (Deleted)
[facility] PROGRESS NOTE    Progress Note  Charles King  [id number] DOB: [birth year] DOA: 12/13/2018 PCP: Alroy Dust, L.Marlou Sa, MD     Brief Narrative:   Charles King is an 68 y.o. male past medical history of DVT, GERD who presents to the ED on 11/29 with progressive shortness of breath dry cough and fevers.  He was evaluated on 12/11/2018 for similar complaint tested negative for SARS-CoV-2, was given Decadron x1 in the ED and discharged home on azithromycin. Came back to the ED hypoxic and tested positive for SARS Covid 2 chest x-ray demonstrates interval development of bilateral peripheral infiltrates. Started on IV remdesivir and steroids and transferred to [facility].  Assessment/Plan:   Acute respiratory failure with hypoxia secondary to COVID-19 pneumonia: He is still requiring 2 L of oxygen to keep saturations greater than 94% we will try to wean to room air. Completed his course of IV remdesivir will continue steroids for total of 10 days. He did receive Actemra on 12/14/2018.  His inflammatory markers are drastically improved. Patient cannot tolerate proning position.  History of DVT: Change enoxaparin  to prophylactic dose.  Hypophosphatemia: Repleted orally now improved.  DVT prophylaxis: lovenox Family Communication:None Disposition Plan/Barrier to D/C: unable to determine Code Status:     Code Status Orders  (From admission, onward)         Start     Ordered   12/13/18 1951  Full code  Continuous     12/13/18 1954        Code Status History    This patient has a current code status but no historical code status.   Advance Care Planning Activity        IV Access:    Peripheral IV   Procedures and diagnostic studies:   No results found.   Medical Consultants:    None.  Anti-Infectives:   IV remdesivir  Subjective:    Charles King Half he relates his breathing continues to improve.  Objective:    Vitals:   12/20/18 0010 12/20/18 0020 12/20/18 0039 12/20/18 0403  BP:   110/74 101/71  Pulse: (!) 103 94 87 61  Resp:    18  Temp:    98.3 F (36.8 C)  TempSrc:    Oral  SpO2: 93% 93% 96% 96%  Weight:      Height:       SpO2: 96 % O2 Flow Rate (L/min): 2 L/min   Intake/Output Summary (Last 24 hours) at 12/20/2018 0821 Last data filed at 12/20/2018 0400 Gross per 24 hour  Intake 2400 ml  Output 2580 ml  Net -180 ml   Filed Weights   12/13/18 1425  Weight: 114.8 kg    Exam: General exam: In no acute distress. Respiratory system: Good air movement and diffuse crackles bilaterally. Cardiovascular system: S1 & S2 heard, RRR. No JVD. Gastrointestinal system: Abdomen is nondistended, soft and nontender.  Central nervous system: Alert and oriented. No focal neurological deficits. Extremities: No pedal edema. Skin: No rashes, lesions or ulcers Psychiatry: Judgement and insight appear normal. Mood & affect appropriate.    Data Reviewed:    Labs: Basic Metabolic Panel: Recent Labs  Lab 12/14/18 0212 12/15/18 0251 12/16/18 0010 12/17/18 0000 12/18/18 0008  NA 139 140 141 139 140  K 3.8 3.8 3.7 3.9 4.0  CL 110 109 106 104 106  CO2 19* 21* 22 22 22   GLUCOSE 177* 150* 141* 111* 127*  BUN 17 24* 33* 36* 41*  CREATININE  1.02 0.87 1.02 0.89 0.99  CALCIUM 8.0* 8.5* 8.7* 8.6* 8.2*  MG 2.0 2.0 2.0 2.2 2.3  PHOS 1.8* 2.3* 2.9 2.6 3.0   GFR Estimated Creatinine Clearance: 94.7 mL/min (by C-G formula based on SCr of 0.99 mg/dL). Liver Function Tests: Recent Labs  Lab 12/14/18 0212 12/15/18 0251 12/16/18 0010 12/17/18 0000 12/18/18 0008  AST 33 25 33 47* 41  ALT 28 26 37 55* 62*  ALKPHOS 50 48 54 54 68  BILITOT 0.9 0.3 0.3 0.4 0.8  PROT 6.5 6.5 6.4* 6.0* 5.9*  ALBUMIN 3.0* 2.7* 2.8* 2.8* 2.9*   No results for input(s): LIPASE, AMYLASE in the last 168 hours. No results for input(s): AMMONIA in the last 168 hours. Coagulation profile Recent Labs  Lab 12/13/18 1753  INR  1.0   COVID-19 Labs  Recent Labs    12/18/18 0008  DDIMER 1.32*  FERRITIN 330  CRP 3.3*    Lab Results  Component Value Date   SARSCOV2NAA NEGATIVE 12/11/2018   [place] Not Detected 12/04/2018    CBC: Recent Labs  Lab 12/14/18 0212 12/15/18 0251 12/16/18 0010 12/17/18 0000 12/18/18 0008  WBC 9.5 7.1 11.2* 12.8* 16.2*  NEUTROABS 8.7* 6.1 9.4* 10.4* 12.4*  HGB 13.0 11.9* 11.9* 12.7* 12.8*  HCT 38.0* 35.5* 35.5* 37.9* 37.5*  MCV 98.4 97.0 98.1 98.4 97.2  PLT 178 212 263 277 307   Cardiac Enzymes: No results for input(s): CKTOTAL, CKMB, CKMBINDEX, TROPONINI in the last 168 hours. BNP (last 3 results) No results for input(s): PROBNP in the last 8760 hours. CBG: Recent Labs  Lab 12/18/18 1959 12/19/18 0020 12/19/18 0839 12/19/18 1156 12/19/18 1700  GLUCAP 176* 146* 126* 180* 102*   D-Dimer: Recent Labs    12/18/18 0008  DDIMER 1.32*   Hgb A1c: No results for input(s): HGBA1C in the last 72 hours. Lipid Profile: No results for input(s): CHOL, HDL, LDLCALC, TRIG, CHOLHDL, LDLDIRECT in the last 72 hours. Thyroid function studies: No results for input(s): TSH, T4TOTAL, T3FREE, THYROIDAB in the last 72 hours.  Invalid input(s): FREET3 Anemia work up: Recent Labs    12/18/18 0008  FERRITIN 330   Sepsis Labs: Recent Labs  Lab 12/13/18 1753 12/13/18 1917  12/15/18 0251 12/16/18 0010 12/17/18 0000 12/18/18 0008  PROCALCITON 0.43  --   --   --   --   --   --   WBC  --   --    < > 7.1 11.2* 12.8* 16.2*  LATICACIDVEN 1.8 1.7  --   --   --   --   --    < > = values in this interval not displayed.   Microbiology Recent Results (from the past 240 hour(s))  SARS CORONAVIRUS 2 (TAT 6-24 HRS) Nasopharyngeal Nasopharyngeal Swab     Status: None   Collection Time: 12/11/18  4:06 PM   Specimen: Nasopharyngeal Swab  Result Value Ref Range Status   SARS Coronavirus 2 NEGATIVE NEGATIVE Final    Comment: (NOTE) SARS-CoV-2 target nucleic acids are NOT  DETECTED. The SARS-CoV-2 RNA is generally detectable in upper and lower respiratory specimens during the acute phase of infection. Negative results do not preclude SARS-CoV-2 infection, do not rule out co-infections with other pathogens, and should not be used as the sole basis for treatment or other patient management decisions. Negative results must be combined with clinical observations, patient history, and epidemiological information. The expected result is Negative. Fact Sheet for Patients: SugarRoll.be Fact Sheet for Healthcare Providers: https://www.woods-mathews.com/  This test is not yet approved or cleared by the [place] and  has been authorized for detection and/or diagnosis of SARS-CoV-2 by FDA under an Emergency Use Authorization (EUA). This EUA will remain  in effect (meaning this test can be used) for the duration of the COVID-19 declaration under Section 56 4(b)(1) of the Act, 21 U.S.C. section 360bbb-3(b)(1), unless the authorization is terminated or revoked sooner. Performed at [facility], [place] [address], [place], [place] [address]   Blood Culture (routine x 2)     Status: None   Collection Time: 12/13/18  7:43 PM   Specimen: BLOOD  Result Value Ref Range Status   Specimen Description BLOOD RIGHT ANTECUBITAL  Final   Special Requests   Final    BOTTLES DRAWN AEROBIC AND ANAEROBIC Blood Culture results may not be optimal due to an inadequate volume of blood received in culture bottles   Culture   Final    NO GROWTH 5 DAYS Performed at [facility], [place] [address], [place], [place] [address]    Report Status 12/18/2018 FINAL  Final  Blood Culture (routine x 2)     Status: None   Collection Time: 12/13/18  7:46 PM   Specimen: BLOOD RIGHT HAND  Result Value Ref Range Status   Specimen Description BLOOD RIGHT HAND  Final   Special Requests   Final    BOTTLES DRAWN AEROBIC AND ANAEROBIC Blood Culture  results may not be optimal due to an inadequate volume of blood received in culture bottles   Culture   Final    NO GROWTH 5 DAYS Performed at [facility], [place] [address] [place], [place] [address]    Report Status 12/18/2018 FINAL  Final     Medications:   . albuterol  2 puff Inhalation Q6H  . dexamethasone (DECADRON) injection  6 mg Intravenous Q12H  . enoxaparin (LOVENOX) injection  40 mg Subcutaneous Q24H  . fluticasone  2 spray Each Nare Daily  . insulin aspart  0-15 Units Subcutaneous TID WC  . insulin aspart  0-5 Units Subcutaneous QHS  . insulin aspart  3 Units Subcutaneous TID WC  . insulin detemir  5 Units Subcutaneous QHS  . mouth rinse  15 mL Mouth Rinse BID  . pantoprazole  40 mg Oral Daily  . phosphorus  250 mg Oral Daily  . pneumococcal 23 valent vaccine  0.5 mL Intramuscular Tomorrow-1000  . sodium chloride flush  3 mL Intravenous Once  . vitamin C  500 mg Oral Daily  . zinc sulfate  220 mg Oral Daily   Continuous Infusions:     LOS: 7 days   Charlynne Cousins  [facility]  12/20/2018, 8:21 AM

## 2018-12-24 ENCOUNTER — Encounter (INDEPENDENT_AMBULATORY_CARE_PROVIDER_SITE_OTHER): Payer: Self-pay

## 2018-12-27 ENCOUNTER — Encounter (INDEPENDENT_AMBULATORY_CARE_PROVIDER_SITE_OTHER): Payer: Self-pay

## 2018-12-30 ENCOUNTER — Encounter (INDEPENDENT_AMBULATORY_CARE_PROVIDER_SITE_OTHER): Payer: Self-pay

## 2018-12-31 ENCOUNTER — Ambulatory Visit
Admission: RE | Admit: 2018-12-31 | Discharge: 2018-12-31 | Disposition: A | Discharge status: Home / Self Care | Payer: BC Managed Care – PPO | Source: Ambulatory Visit | Attending: Family Medicine | Admitting: Family Medicine

## 2018-12-31 ENCOUNTER — Other Ambulatory Visit: Payer: Self-pay | Admitting: Family Medicine

## 2018-12-31 DIAGNOSIS — M79662 Pain in left lower leg: Secondary | ICD-10-CM

## 2018-12-31 DIAGNOSIS — M25472 Effusion, left ankle: Secondary | ICD-10-CM

## 2019-01-01 ENCOUNTER — Encounter (INDEPENDENT_AMBULATORY_CARE_PROVIDER_SITE_OTHER): Payer: Self-pay

## 2019-04-08 ENCOUNTER — Ambulatory Visit: Payer: BC Managed Care – PPO | Attending: Internal Medicine

## 2019-04-08 DIAGNOSIS — Z23 Encounter for immunization: Secondary | ICD-10-CM

## 2019-04-08 NOTE — Progress Notes (Signed)
   Covid-19 Vaccination Clinic  Name:  Charles King    MRN: [id number] DOB: [birth year]  04/08/2019  Mr. [name] was observed post Covid-19 immunization for 15 minutes without incident. He was provided with Vaccine Information Sheet and instruction to access the V-Safe system.   Mr. [name] was instructed to call 911 with any severe reactions post vaccine: Marland Kitchen Difficulty breathing  . Swelling of face and throat  . A fast heartbeat  . A bad rash all over body  . Dizziness and weakness   Immunizations Administered    Name Date Dose VIS Date Route   Pfizer COVID-19 Vaccine 04/08/2019  8:53 AM 0.3 mL 12/25/2018 Intramuscular   Manufacturer: [place]   Lot: [id number]   [place]: [id number]

## 2019-05-03 ENCOUNTER — Ambulatory Visit: Payer: BC Managed Care – PPO | Attending: Internal Medicine

## 2019-05-03 DIAGNOSIS — Z23 Encounter for immunization: Secondary | ICD-10-CM

## 2019-05-03 NOTE — Progress Notes (Signed)
   Covid-19 Vaccination Clinic  Name:  Miriam Biondo    MRN: [id number] DOB: 1950-06-14  05/03/2019  Mr. Winker was observed post Covid-19 immunization for 15 minutes without incident. He was provided with Vaccine Information Sheet and instruction to access the V-Safe system.   Mr. Bassani was instructed to call 911 with any severe reactions post vaccine: Marland Kitchen Difficulty breathing  . Swelling of face and throat  . A fast heartbeat  . A bad rash all over body  . Dizziness and weakness   Immunizations Administered    Name Date Dose VIS Date Route   Pfizer COVID-19 Vaccine 05/03/2019  8:32 AM 0.3 mL 03/10/2018 Intramuscular   Manufacturer: [place]   Lot: [id number]   [place]: [id number]

## 2020-10-02 ENCOUNTER — Other Ambulatory Visit: Payer: Self-pay | Admitting: Family Medicine

## 2020-10-02 ENCOUNTER — Ambulatory Visit
Admission: RE | Admit: 2020-10-02 | Discharge: 2020-10-02 | Disposition: A | Discharge status: Home / Self Care | Payer: BC Managed Care – PPO | Source: Ambulatory Visit | Attending: Family Medicine | Admitting: Family Medicine

## 2020-10-02 DIAGNOSIS — M79661 Pain in right lower leg: Secondary | ICD-10-CM

## 2020-10-02 DIAGNOSIS — M79671 Pain in right foot: Secondary | ICD-10-CM

## 2021-02-20 IMAGING — DX DG CHEST 1V PORT
1 series · 1 of 1 positions shown · non-contrast
Comparison: Portable exam 9765 hours without priors for comparison

CLINICAL DATA: Generalized weakness, dry cough, shortness of
breath, fever, worse with 3 people who tested positive for COVID
last week but his test this past [REDACTED] was negative

EXAM:
PORTABLE CHEST 1 VIEW

[chest ap]
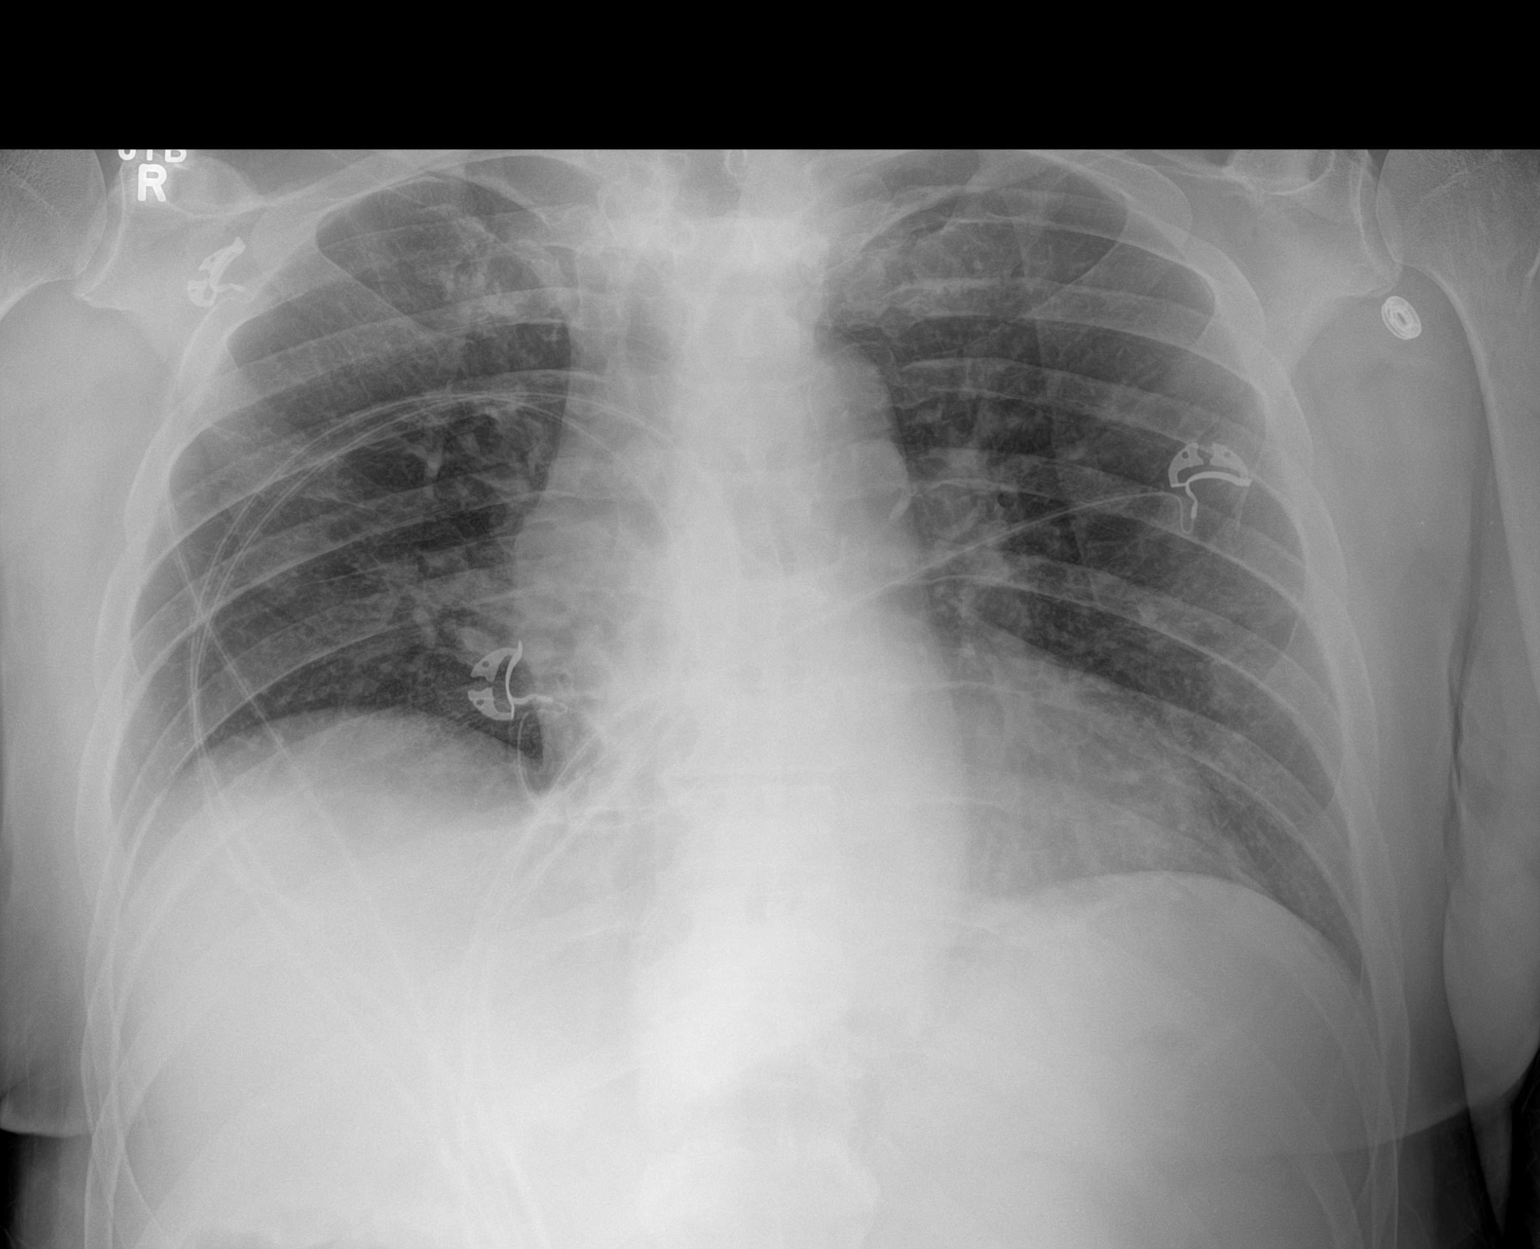

[1 of 1 positions shown; findings below may reference images not displayed]

FINDINGS: Upper normal heart size.

Mediastinal contours and pulmonary vascularity normal.

Question mild patchy infiltrate LEFT mid lung.

Remaining lungs clear.

No pleural effusion or pneumothorax.

No acute osseous findings.
IMPRESSION: Question mild patchy infiltrate at LEFT mid lung.

## 2021-06-25 DIAGNOSIS — M1711 Unilateral primary osteoarthritis, right knee: Secondary | ICD-10-CM | POA: Diagnosis not present

## 2021-06-25 DIAGNOSIS — E782 Mixed hyperlipidemia: Secondary | ICD-10-CM | POA: Diagnosis not present

## 2021-06-25 DIAGNOSIS — B079 Viral wart, unspecified: Secondary | ICD-10-CM | POA: Diagnosis not present

## 2021-06-25 DIAGNOSIS — Z0000 Encounter for general adult medical examination without abnormal findings: Secondary | ICD-10-CM | POA: Diagnosis not present

## 2021-06-25 DIAGNOSIS — R7303 Prediabetes: Secondary | ICD-10-CM | POA: Diagnosis not present

## 2021-07-18 DIAGNOSIS — M7581 Other shoulder lesions, right shoulder: Secondary | ICD-10-CM | POA: Diagnosis not present

## 2021-07-18 DIAGNOSIS — K5792 Diverticulitis of intestine, part unspecified, without perforation or abscess without bleeding: Secondary | ICD-10-CM | POA: Diagnosis not present

## 2021-07-23 DIAGNOSIS — K5900 Constipation, unspecified: Secondary | ICD-10-CM | POA: Diagnosis not present

## 2021-07-23 DIAGNOSIS — E278 Other specified disorders of adrenal gland: Secondary | ICD-10-CM | POA: Diagnosis not present

## 2021-07-23 DIAGNOSIS — K7689 Other specified diseases of liver: Secondary | ICD-10-CM | POA: Diagnosis not present

## 2021-07-23 DIAGNOSIS — K668 Other specified disorders of peritoneum: Secondary | ICD-10-CM | POA: Diagnosis not present

## 2021-07-23 DIAGNOSIS — R1031 Right lower quadrant pain: Secondary | ICD-10-CM | POA: Diagnosis not present

## 2021-07-26 DIAGNOSIS — K6389 Other specified diseases of intestine: Secondary | ICD-10-CM | POA: Diagnosis not present

## 2021-08-09 DIAGNOSIS — R896 Abnormal cytological findings in specimens from other organs, systems and tissues: Secondary | ICD-10-CM | POA: Diagnosis not present

## 2021-08-09 DIAGNOSIS — Z8616 Personal history of COVID-19: Secondary | ICD-10-CM | POA: Diagnosis not present

## 2021-08-09 DIAGNOSIS — Z9889 Other specified postprocedural states: Secondary | ICD-10-CM | POA: Diagnosis not present

## 2021-08-09 DIAGNOSIS — R1909 Other intra-abdominal and pelvic swelling, mass and lump: Secondary | ICD-10-CM | POA: Diagnosis not present

## 2021-08-09 DIAGNOSIS — K219 Gastro-esophageal reflux disease without esophagitis: Secondary | ICD-10-CM | POA: Diagnosis not present

## 2021-08-09 DIAGNOSIS — Z87891 Personal history of nicotine dependence: Secondary | ICD-10-CM | POA: Diagnosis not present

## 2021-08-09 DIAGNOSIS — R339 Retention of urine, unspecified: Secondary | ICD-10-CM | POA: Diagnosis not present

## 2021-08-09 DIAGNOSIS — M1990 Unspecified osteoarthritis, unspecified site: Secondary | ICD-10-CM | POA: Diagnosis not present

## 2021-08-09 DIAGNOSIS — K6389 Other specified diseases of intestine: Secondary | ICD-10-CM | POA: Diagnosis not present

## 2021-08-09 DIAGNOSIS — R897 Abnormal histological findings in specimens from other organs, systems and tissues: Secondary | ICD-10-CM | POA: Diagnosis not present

## 2021-08-09 DIAGNOSIS — C8103 Nodular lymphocyte predominant Hodgkin lymphoma, intra-abdominal lymph nodes: Secondary | ICD-10-CM | POA: Diagnosis not present

## 2021-08-09 DIAGNOSIS — Z86718 Personal history of other venous thrombosis and embolism: Secondary | ICD-10-CM | POA: Diagnosis not present

## 2021-08-13 DIAGNOSIS — Z4889 Encounter for other specified surgical aftercare: Secondary | ICD-10-CM | POA: Diagnosis not present

## 2021-08-16 ENCOUNTER — Other Ambulatory Visit: Payer: Self-pay | Admitting: Physician Assistant

## 2021-08-16 ENCOUNTER — Ambulatory Visit
Admission: RE | Admit: 2021-08-16 | Discharge: 2021-08-16 | Disposition: A | Discharge status: Home / Self Care | Payer: Medicare Other | Source: Ambulatory Visit | Attending: Physician Assistant | Admitting: Physician Assistant

## 2021-08-16 DIAGNOSIS — M25511 Pain in right shoulder: Secondary | ICD-10-CM

## 2021-08-28 DIAGNOSIS — C8193 Hodgkin lymphoma, unspecified, intra-abdominal lymph nodes: Secondary | ICD-10-CM | POA: Diagnosis not present

## 2021-08-28 DIAGNOSIS — K6389 Other specified diseases of intestine: Secondary | ICD-10-CM | POA: Diagnosis not present

## 2021-08-28 DIAGNOSIS — Z9889 Other specified postprocedural states: Secondary | ICD-10-CM | POA: Diagnosis not present

## 2021-08-28 DIAGNOSIS — C8108 Nodular lymphocyte predominant Hodgkin lymphoma, lymph nodes of multiple sites: Secondary | ICD-10-CM | POA: Diagnosis not present

## 2021-09-01 DIAGNOSIS — S2231XA Fracture of one rib, right side, initial encounter for closed fracture: Secondary | ICD-10-CM | POA: Diagnosis not present

## 2021-09-01 DIAGNOSIS — Z9889 Other specified postprocedural states: Secondary | ICD-10-CM | POA: Diagnosis not present

## 2021-09-01 DIAGNOSIS — C8108 Nodular lymphocyte predominant Hodgkin lymphoma, lymph nodes of multiple sites: Secondary | ICD-10-CM | POA: Diagnosis not present

## 2021-09-01 DIAGNOSIS — K449 Diaphragmatic hernia without obstruction or gangrene: Secondary | ICD-10-CM | POA: Diagnosis not present

## 2021-09-06 DIAGNOSIS — C8108 Nodular lymphocyte predominant Hodgkin lymphoma, lymph nodes of multiple sites: Secondary | ICD-10-CM | POA: Diagnosis not present

## 2021-09-13 DIAGNOSIS — M25511 Pain in right shoulder: Secondary | ICD-10-CM | POA: Diagnosis not present

## 2021-09-18 DIAGNOSIS — M25511 Pain in right shoulder: Secondary | ICD-10-CM | POA: Diagnosis not present

## 2021-09-24 DIAGNOSIS — C8108 Nodular lymphocyte predominant Hodgkin lymphoma, lymph nodes of multiple sites: Secondary | ICD-10-CM | POA: Diagnosis not present

## 2021-09-24 DIAGNOSIS — C8103 Nodular lymphocyte predominant Hodgkin lymphoma, intra-abdominal lymph nodes: Secondary | ICD-10-CM | POA: Diagnosis not present

## 2021-09-25 DIAGNOSIS — C8103 Nodular lymphocyte predominant Hodgkin lymphoma, intra-abdominal lymph nodes: Secondary | ICD-10-CM | POA: Diagnosis not present

## 2021-10-04 DIAGNOSIS — Z510 Encounter for antineoplastic radiation therapy: Secondary | ICD-10-CM | POA: Diagnosis not present

## 2021-10-04 DIAGNOSIS — C8103 Nodular lymphocyte predominant Hodgkin lymphoma, intra-abdominal lymph nodes: Secondary | ICD-10-CM | POA: Diagnosis not present

## 2021-10-24 DIAGNOSIS — M25562 Pain in left knee: Secondary | ICD-10-CM | POA: Diagnosis not present

## 2021-10-25 DIAGNOSIS — C8103 Nodular lymphocyte predominant Hodgkin lymphoma, intra-abdominal lymph nodes: Secondary | ICD-10-CM | POA: Diagnosis not present

## 2021-10-25 DIAGNOSIS — Z510 Encounter for antineoplastic radiation therapy: Secondary | ICD-10-CM | POA: Diagnosis not present

## 2021-10-26 DIAGNOSIS — C8103 Nodular lymphocyte predominant Hodgkin lymphoma, intra-abdominal lymph nodes: Secondary | ICD-10-CM | POA: Diagnosis not present

## 2021-11-05 DIAGNOSIS — Z510 Encounter for antineoplastic radiation therapy: Secondary | ICD-10-CM | POA: Diagnosis not present

## 2021-11-05 DIAGNOSIS — C8103 Nodular lymphocyte predominant Hodgkin lymphoma, intra-abdominal lymph nodes: Secondary | ICD-10-CM | POA: Diagnosis not present

## 2021-11-06 DIAGNOSIS — C8103 Nodular lymphocyte predominant Hodgkin lymphoma, intra-abdominal lymph nodes: Secondary | ICD-10-CM | POA: Diagnosis not present

## 2021-11-06 DIAGNOSIS — Z510 Encounter for antineoplastic radiation therapy: Secondary | ICD-10-CM | POA: Diagnosis not present

## 2021-11-07 DIAGNOSIS — Z510 Encounter for antineoplastic radiation therapy: Secondary | ICD-10-CM | POA: Diagnosis not present

## 2021-11-07 DIAGNOSIS — C8103 Nodular lymphocyte predominant Hodgkin lymphoma, intra-abdominal lymph nodes: Secondary | ICD-10-CM | POA: Diagnosis not present

## 2021-11-09 DIAGNOSIS — C8103 Nodular lymphocyte predominant Hodgkin lymphoma, intra-abdominal lymph nodes: Secondary | ICD-10-CM | POA: Diagnosis not present

## 2021-11-09 DIAGNOSIS — Z510 Encounter for antineoplastic radiation therapy: Secondary | ICD-10-CM | POA: Diagnosis not present

## 2021-11-12 DIAGNOSIS — C8103 Nodular lymphocyte predominant Hodgkin lymphoma, intra-abdominal lymph nodes: Secondary | ICD-10-CM | POA: Diagnosis not present

## 2021-11-12 DIAGNOSIS — Z510 Encounter for antineoplastic radiation therapy: Secondary | ICD-10-CM | POA: Diagnosis not present

## 2021-11-13 DIAGNOSIS — C8103 Nodular lymphocyte predominant Hodgkin lymphoma, intra-abdominal lymph nodes: Secondary | ICD-10-CM | POA: Diagnosis not present

## 2021-11-13 DIAGNOSIS — Z510 Encounter for antineoplastic radiation therapy: Secondary | ICD-10-CM | POA: Diagnosis not present

## 2021-11-14 DIAGNOSIS — Z510 Encounter for antineoplastic radiation therapy: Secondary | ICD-10-CM | POA: Diagnosis not present

## 2021-11-14 DIAGNOSIS — C8100 Nodular lymphocyte predominant Hodgkin lymphoma, unspecified site: Secondary | ICD-10-CM | POA: Diagnosis not present

## 2021-11-14 DIAGNOSIS — C8103 Nodular lymphocyte predominant Hodgkin lymphoma, intra-abdominal lymph nodes: Secondary | ICD-10-CM | POA: Diagnosis not present

## 2021-11-15 DIAGNOSIS — C8100 Nodular lymphocyte predominant Hodgkin lymphoma, unspecified site: Secondary | ICD-10-CM | POA: Diagnosis not present

## 2021-11-15 DIAGNOSIS — C8103 Nodular lymphocyte predominant Hodgkin lymphoma, intra-abdominal lymph nodes: Secondary | ICD-10-CM | POA: Diagnosis not present

## 2021-11-15 DIAGNOSIS — M25562 Pain in left knee: Secondary | ICD-10-CM | POA: Diagnosis not present

## 2021-11-15 DIAGNOSIS — Z510 Encounter for antineoplastic radiation therapy: Secondary | ICD-10-CM | POA: Diagnosis not present

## 2021-11-16 ENCOUNTER — Other Ambulatory Visit: Payer: Self-pay | Admitting: Sports Medicine

## 2021-11-16 DIAGNOSIS — C8100 Nodular lymphocyte predominant Hodgkin lymphoma, unspecified site: Secondary | ICD-10-CM | POA: Diagnosis not present

## 2021-11-16 DIAGNOSIS — Z510 Encounter for antineoplastic radiation therapy: Secondary | ICD-10-CM | POA: Diagnosis not present

## 2021-11-16 DIAGNOSIS — M25562 Pain in left knee: Secondary | ICD-10-CM

## 2021-11-16 DIAGNOSIS — C8103 Nodular lymphocyte predominant Hodgkin lymphoma, intra-abdominal lymph nodes: Secondary | ICD-10-CM | POA: Diagnosis not present

## 2021-11-19 DIAGNOSIS — C8100 Nodular lymphocyte predominant Hodgkin lymphoma, unspecified site: Secondary | ICD-10-CM | POA: Diagnosis not present

## 2021-11-19 DIAGNOSIS — Z510 Encounter for antineoplastic radiation therapy: Secondary | ICD-10-CM | POA: Diagnosis not present

## 2021-11-19 DIAGNOSIS — C8103 Nodular lymphocyte predominant Hodgkin lymphoma, intra-abdominal lymph nodes: Secondary | ICD-10-CM | POA: Diagnosis not present

## 2021-11-20 DIAGNOSIS — C8100 Nodular lymphocyte predominant Hodgkin lymphoma, unspecified site: Secondary | ICD-10-CM | POA: Diagnosis not present

## 2021-11-20 DIAGNOSIS — C8103 Nodular lymphocyte predominant Hodgkin lymphoma, intra-abdominal lymph nodes: Secondary | ICD-10-CM | POA: Diagnosis not present

## 2021-11-20 DIAGNOSIS — Z510 Encounter for antineoplastic radiation therapy: Secondary | ICD-10-CM | POA: Diagnosis not present

## 2021-11-21 DIAGNOSIS — Z510 Encounter for antineoplastic radiation therapy: Secondary | ICD-10-CM | POA: Diagnosis not present

## 2021-11-21 DIAGNOSIS — C8100 Nodular lymphocyte predominant Hodgkin lymphoma, unspecified site: Secondary | ICD-10-CM | POA: Diagnosis not present

## 2021-11-21 DIAGNOSIS — C8103 Nodular lymphocyte predominant Hodgkin lymphoma, intra-abdominal lymph nodes: Secondary | ICD-10-CM | POA: Diagnosis not present

## 2021-11-22 DIAGNOSIS — C8103 Nodular lymphocyte predominant Hodgkin lymphoma, intra-abdominal lymph nodes: Secondary | ICD-10-CM | POA: Diagnosis not present

## 2021-11-22 DIAGNOSIS — C8100 Nodular lymphocyte predominant Hodgkin lymphoma, unspecified site: Secondary | ICD-10-CM | POA: Diagnosis not present

## 2021-11-22 DIAGNOSIS — Z510 Encounter for antineoplastic radiation therapy: Secondary | ICD-10-CM | POA: Diagnosis not present

## 2021-11-23 DIAGNOSIS — C8103 Nodular lymphocyte predominant Hodgkin lymphoma, intra-abdominal lymph nodes: Secondary | ICD-10-CM | POA: Diagnosis not present

## 2021-11-23 DIAGNOSIS — Z510 Encounter for antineoplastic radiation therapy: Secondary | ICD-10-CM | POA: Diagnosis not present

## 2021-11-23 DIAGNOSIS — C8100 Nodular lymphocyte predominant Hodgkin lymphoma, unspecified site: Secondary | ICD-10-CM | POA: Diagnosis not present

## 2021-11-26 DIAGNOSIS — C8100 Nodular lymphocyte predominant Hodgkin lymphoma, unspecified site: Secondary | ICD-10-CM | POA: Diagnosis not present

## 2021-11-26 DIAGNOSIS — C8103 Nodular lymphocyte predominant Hodgkin lymphoma, intra-abdominal lymph nodes: Secondary | ICD-10-CM | POA: Diagnosis not present

## 2021-11-26 DIAGNOSIS — Z510 Encounter for antineoplastic radiation therapy: Secondary | ICD-10-CM | POA: Diagnosis not present

## 2021-11-29 ENCOUNTER — Ambulatory Visit
Admission: RE | Admit: 2021-11-29 | Discharge: 2021-11-29 | Disposition: A | Discharge status: Home / Self Care | Payer: Medicare Other | Source: Ambulatory Visit | Attending: Sports Medicine | Admitting: Sports Medicine

## 2021-11-29 DIAGNOSIS — M25562 Pain in left knee: Secondary | ICD-10-CM

## 2021-12-04 DIAGNOSIS — C8108 Nodular lymphocyte predominant Hodgkin lymphoma, lymph nodes of multiple sites: Secondary | ICD-10-CM | POA: Diagnosis not present

## 2021-12-04 DIAGNOSIS — R110 Nausea: Secondary | ICD-10-CM | POA: Diagnosis not present

## 2021-12-04 DIAGNOSIS — M1712 Unilateral primary osteoarthritis, left knee: Secondary | ICD-10-CM | POA: Diagnosis not present

## 2021-12-04 DIAGNOSIS — R197 Diarrhea, unspecified: Secondary | ICD-10-CM | POA: Diagnosis not present

## 2021-12-27 DIAGNOSIS — C8193 Hodgkin lymphoma, unspecified, intra-abdominal lymph nodes: Secondary | ICD-10-CM | POA: Diagnosis not present

## 2021-12-27 DIAGNOSIS — K2100 Gastro-esophageal reflux disease with esophagitis, without bleeding: Secondary | ICD-10-CM | POA: Diagnosis not present

## 2022-01-22 DIAGNOSIS — M1712 Unilateral primary osteoarthritis, left knee: Secondary | ICD-10-CM | POA: Diagnosis not present

## 2022-01-29 DIAGNOSIS — K449 Diaphragmatic hernia without obstruction or gangrene: Secondary | ICD-10-CM | POA: Diagnosis not present

## 2022-01-29 DIAGNOSIS — M1712 Unilateral primary osteoarthritis, left knee: Secondary | ICD-10-CM | POA: Diagnosis not present

## 2022-01-29 DIAGNOSIS — K5730 Diverticulosis of large intestine without perforation or abscess without bleeding: Secondary | ICD-10-CM | POA: Diagnosis not present

## 2022-01-29 DIAGNOSIS — C8108 Nodular lymphocyte predominant Hodgkin lymphoma, lymph nodes of multiple sites: Secondary | ICD-10-CM | POA: Diagnosis not present

## 2022-01-29 DIAGNOSIS — K429 Umbilical hernia without obstruction or gangrene: Secondary | ICD-10-CM | POA: Diagnosis not present

## 2022-02-04 DIAGNOSIS — D696 Thrombocytopenia, unspecified: Secondary | ICD-10-CM | POA: Diagnosis not present

## 2022-02-04 DIAGNOSIS — C8108 Nodular lymphocyte predominant Hodgkin lymphoma, lymph nodes of multiple sites: Secondary | ICD-10-CM | POA: Diagnosis not present

## 2022-02-05 DIAGNOSIS — M1711 Unilateral primary osteoarthritis, right knee: Secondary | ICD-10-CM | POA: Diagnosis not present

## 2022-02-25 DIAGNOSIS — K449 Diaphragmatic hernia without obstruction or gangrene: Secondary | ICD-10-CM | POA: Diagnosis not present

## 2022-02-25 DIAGNOSIS — K2100 Gastro-esophageal reflux disease with esophagitis, without bleeding: Secondary | ICD-10-CM | POA: Diagnosis not present

## 2022-03-11 DIAGNOSIS — K2100 Gastro-esophageal reflux disease with esophagitis, without bleeding: Secondary | ICD-10-CM | POA: Diagnosis not present

## 2022-03-11 DIAGNOSIS — K449 Diaphragmatic hernia without obstruction or gangrene: Secondary | ICD-10-CM | POA: Diagnosis not present

## 2022-03-11 DIAGNOSIS — Z9889 Other specified postprocedural states: Secondary | ICD-10-CM | POA: Diagnosis not present

## 2022-03-12 DIAGNOSIS — M25511 Pain in right shoulder: Secondary | ICD-10-CM | POA: Diagnosis not present

## 2022-03-21 DIAGNOSIS — K449 Diaphragmatic hernia without obstruction or gangrene: Secondary | ICD-10-CM | POA: Diagnosis not present

## 2022-03-21 DIAGNOSIS — K2100 Gastro-esophageal reflux disease with esophagitis, without bleeding: Secondary | ICD-10-CM | POA: Diagnosis not present

## 2022-04-22 DIAGNOSIS — H35369 Drusen (degenerative) of macula, unspecified eye: Secondary | ICD-10-CM | POA: Diagnosis not present

## 2022-05-02 DIAGNOSIS — C8108 Nodular lymphocyte predominant Hodgkin lymphoma, lymph nodes of multiple sites: Secondary | ICD-10-CM | POA: Diagnosis not present

## 2022-05-02 DIAGNOSIS — D696 Thrombocytopenia, unspecified: Secondary | ICD-10-CM | POA: Diagnosis not present

## 2022-06-20 DIAGNOSIS — M25511 Pain in right shoulder: Secondary | ICD-10-CM | POA: Diagnosis not present

## 2022-06-25 DIAGNOSIS — K219 Gastro-esophageal reflux disease without esophagitis: Secondary | ICD-10-CM | POA: Diagnosis not present

## 2022-06-25 DIAGNOSIS — K2270 Barrett's esophagus without dysplasia: Secondary | ICD-10-CM | POA: Diagnosis not present

## 2022-07-04 DIAGNOSIS — Z0000 Encounter for general adult medical examination without abnormal findings: Secondary | ICD-10-CM | POA: Diagnosis not present

## 2022-07-04 DIAGNOSIS — R7303 Prediabetes: Secondary | ICD-10-CM | POA: Diagnosis not present

## 2022-07-04 DIAGNOSIS — E782 Mixed hyperlipidemia: Secondary | ICD-10-CM | POA: Diagnosis not present

## 2022-07-25 DIAGNOSIS — M25511 Pain in right shoulder: Secondary | ICD-10-CM | POA: Diagnosis not present

## 2022-07-31 DIAGNOSIS — M25511 Pain in right shoulder: Secondary | ICD-10-CM | POA: Diagnosis not present

## 2022-08-06 DIAGNOSIS — K219 Gastro-esophageal reflux disease without esophagitis: Secondary | ICD-10-CM | POA: Diagnosis not present

## 2022-08-06 DIAGNOSIS — C8108 Nodular lymphocyte predominant Hodgkin lymphoma, lymph nodes of multiple sites: Secondary | ICD-10-CM | POA: Diagnosis not present

## 2022-08-07 DIAGNOSIS — M25511 Pain in right shoulder: Secondary | ICD-10-CM | POA: Diagnosis not present

## 2022-08-21 DIAGNOSIS — S46011A Strain of muscle(s) and tendon(s) of the rotator cuff of right shoulder, initial encounter: Secondary | ICD-10-CM | POA: Diagnosis not present

## 2022-08-21 DIAGNOSIS — M5010 Cervical disc disorder with radiculopathy, unspecified cervical region: Secondary | ICD-10-CM | POA: Diagnosis not present

## 2022-08-21 DIAGNOSIS — C8108 Nodular lymphocyte predominant Hodgkin lymphoma, lymph nodes of multiple sites: Secondary | ICD-10-CM | POA: Diagnosis not present

## 2022-08-21 DIAGNOSIS — M7501 Adhesive capsulitis of right shoulder: Secondary | ICD-10-CM | POA: Diagnosis not present

## 2022-08-22 DIAGNOSIS — M25511 Pain in right shoulder: Secondary | ICD-10-CM | POA: Diagnosis not present

## 2022-09-04 DIAGNOSIS — M7501 Adhesive capsulitis of right shoulder: Secondary | ICD-10-CM | POA: Diagnosis not present

## 2022-10-04 DIAGNOSIS — M25511 Pain in right shoulder: Secondary | ICD-10-CM | POA: Diagnosis not present

## 2022-10-08 DIAGNOSIS — M7551 Bursitis of right shoulder: Secondary | ICD-10-CM | POA: Diagnosis not present

## 2022-10-08 DIAGNOSIS — M25411 Effusion, right shoulder: Secondary | ICD-10-CM | POA: Diagnosis not present

## 2022-10-08 DIAGNOSIS — M25511 Pain in right shoulder: Secondary | ICD-10-CM | POA: Diagnosis not present

## 2022-11-06 DIAGNOSIS — M7551 Bursitis of right shoulder: Secondary | ICD-10-CM | POA: Diagnosis not present

## 2022-12-09 DIAGNOSIS — C8108 Nodular lymphocyte predominant Hodgkin lymphoma, lymph nodes of multiple sites: Secondary | ICD-10-CM | POA: Diagnosis not present

## 2022-12-09 DIAGNOSIS — D696 Thrombocytopenia, unspecified: Secondary | ICD-10-CM | POA: Diagnosis not present

## 2023-01-03 DIAGNOSIS — R7303 Prediabetes: Secondary | ICD-10-CM | POA: Diagnosis not present

## 2023-01-03 DIAGNOSIS — M79675 Pain in left toe(s): Secondary | ICD-10-CM | POA: Diagnosis not present

## 2023-02-19 DIAGNOSIS — B079 Viral wart, unspecified: Secondary | ICD-10-CM | POA: Diagnosis not present

## 2023-03-04 DIAGNOSIS — C8108 Nodular lymphocyte predominant Hodgkin lymphoma, lymph nodes of multiple sites: Secondary | ICD-10-CM | POA: Diagnosis not present

## 2023-03-04 DIAGNOSIS — K5900 Constipation, unspecified: Secondary | ICD-10-CM | POA: Diagnosis not present

## 2023-03-04 DIAGNOSIS — K449 Diaphragmatic hernia without obstruction or gangrene: Secondary | ICD-10-CM | POA: Diagnosis not present

## 2023-03-04 DIAGNOSIS — R61 Generalized hyperhidrosis: Secondary | ICD-10-CM | POA: Diagnosis not present

## 2023-03-04 DIAGNOSIS — R634 Abnormal weight loss: Secondary | ICD-10-CM | POA: Diagnosis not present

## 2023-03-04 DIAGNOSIS — Z8571 Personal history of Hodgkin lymphoma: Secondary | ICD-10-CM | POA: Diagnosis not present

## 2023-03-04 DIAGNOSIS — D72828 Other elevated white blood cell count: Secondary | ICD-10-CM | POA: Diagnosis not present

## 2023-03-06 DIAGNOSIS — B079 Viral wart, unspecified: Secondary | ICD-10-CM | POA: Diagnosis not present

## 2023-03-11 DIAGNOSIS — C8108 Nodular lymphocyte predominant Hodgkin lymphoma, lymph nodes of multiple sites: Secondary | ICD-10-CM | POA: Diagnosis not present

## 2023-03-11 DIAGNOSIS — D696 Thrombocytopenia, unspecified: Secondary | ICD-10-CM | POA: Diagnosis not present

## 2023-03-12 DIAGNOSIS — M7551 Bursitis of right shoulder: Secondary | ICD-10-CM | POA: Diagnosis not present

## 2023-03-18 DIAGNOSIS — D485 Neoplasm of uncertain behavior of skin: Secondary | ICD-10-CM | POA: Diagnosis not present

## 2023-03-28 DIAGNOSIS — T7029XA Other effects of high altitude, initial encounter: Secondary | ICD-10-CM | POA: Diagnosis not present

## 2023-04-01 ENCOUNTER — Ambulatory Visit (HOSPITAL_COMMUNITY)
Admission: RE | Admit: 2023-04-01 | Discharge: 2023-04-01 | Disposition: A | Discharge status: Home / Self Care | Source: Ambulatory Visit | Attending: Physician Assistant | Admitting: Physician Assistant

## 2023-04-01 ENCOUNTER — Other Ambulatory Visit (HOSPITAL_COMMUNITY): Payer: Self-pay | Admitting: Physician Assistant

## 2023-04-01 DIAGNOSIS — I868 Varicose veins of other specified sites: Secondary | ICD-10-CM | POA: Diagnosis not present

## 2023-04-01 DIAGNOSIS — M7989 Other specified soft tissue disorders: Secondary | ICD-10-CM | POA: Diagnosis not present

## 2023-04-01 DIAGNOSIS — I8290 Acute embolism and thrombosis of unspecified vein: Secondary | ICD-10-CM | POA: Diagnosis not present

## 2023-04-01 DIAGNOSIS — Z86718 Personal history of other venous thrombosis and embolism: Secondary | ICD-10-CM | POA: Diagnosis not present

## 2023-04-01 DIAGNOSIS — I82811 Embolism and thrombosis of superficial veins of right lower extremities: Secondary | ICD-10-CM | POA: Diagnosis not present

## 2023-04-01 DIAGNOSIS — Z86711 Personal history of pulmonary embolism: Secondary | ICD-10-CM | POA: Diagnosis not present

## 2023-04-01 NOTE — Progress Notes (Signed)
 Right lower extremity venous duplex has been completed. Preliminary results can be found in CV Proc through chart review.  Results were given to [facility] Red[facility] PA.  04/01/23 11:05 AM Olen Cordial RVT

## 2023-04-02 DIAGNOSIS — M79661 Pain in right lower leg: Secondary | ICD-10-CM | POA: Diagnosis not present

## 2023-04-02 DIAGNOSIS — I82811 Embolism and thrombosis of superficial veins of right lower extremities: Secondary | ICD-10-CM | POA: Diagnosis not present

## 2023-04-02 DIAGNOSIS — I82401 Acute embolism and thrombosis of unspecified deep veins of right lower extremity: Secondary | ICD-10-CM | POA: Diagnosis not present

## 2023-04-02 DIAGNOSIS — M7551 Bursitis of right shoulder: Secondary | ICD-10-CM | POA: Diagnosis not present

## 2023-04-02 DIAGNOSIS — I80201 Phlebitis and thrombophlebitis of unspecified deep vessels of right lower extremity: Secondary | ICD-10-CM | POA: Diagnosis not present

## 2023-04-02 DIAGNOSIS — I8001 Phlebitis and thrombophlebitis of superficial vessels of right lower extremity: Secondary | ICD-10-CM | POA: Diagnosis not present

## 2023-04-16 DIAGNOSIS — I82501 Chronic embolism and thrombosis of unspecified deep veins of right lower extremity: Secondary | ICD-10-CM | POA: Diagnosis not present

## 2023-04-16 DIAGNOSIS — M79604 Pain in right leg: Secondary | ICD-10-CM | POA: Diagnosis not present

## 2023-04-16 DIAGNOSIS — I87391 Chronic venous hypertension (idiopathic) with other complications of right lower extremity: Secondary | ICD-10-CM | POA: Diagnosis not present

## 2023-04-21 DIAGNOSIS — I82461 Acute embolism and thrombosis of right calf muscular vein: Secondary | ICD-10-CM | POA: Diagnosis not present

## 2023-04-21 DIAGNOSIS — C8108 Nodular lymphocyte predominant Hodgkin lymphoma, lymph nodes of multiple sites: Secondary | ICD-10-CM | POA: Diagnosis not present

## 2023-04-21 DIAGNOSIS — D696 Thrombocytopenia, unspecified: Secondary | ICD-10-CM | POA: Diagnosis not present

## 2023-04-21 DIAGNOSIS — Z86718 Personal history of other venous thrombosis and embolism: Secondary | ICD-10-CM | POA: Diagnosis not present

## 2023-04-30 ENCOUNTER — Encounter: Payer: Self-pay | Admitting: Podiatry

## 2023-04-30 ENCOUNTER — Ambulatory Visit: Admitting: Podiatry

## 2023-04-30 DIAGNOSIS — Z7901 Long term (current) use of anticoagulants: Secondary | ICD-10-CM

## 2023-04-30 DIAGNOSIS — M205X2 Other deformities of toe(s) (acquired), left foot: Secondary | ICD-10-CM

## 2023-04-30 DIAGNOSIS — L84 Corns and callosities: Secondary | ICD-10-CM | POA: Diagnosis not present

## 2023-04-30 NOTE — Progress Notes (Signed)
  Subjective:  Patient ID: Charles King, male    DOB: March 25, 1950,  MRN: [id number]  Chief Complaint  Patient presents with   Callouses    RM#11 Left foot pinky toe pain very painful has treated with pcp and dermatology nothing has helped.    Discussed the use of AI scribe software for clinical note transcription with the patient, who gave verbal consent to proceed.  History of Present Illness The patient presents with a painful lesion on the left fifth toe. Initially treated as a wart, a dermatologist later diagnosed it as a corn. Despite using corn removers and padding to alleviate pressure, the patient reports no improvement in symptoms. The patient denies any injuries to the toe and any bleeding or drainage from the lesion.   The patient is currently on Eliquis for blood clots in the right leg.      Objective:    Physical Exam General: AAO x3, NAD  Dermatological: On the lateral aspect of the left fifth toe just adjacent to the toenail is a hyperkeratotic lesion without any underlying ulceration, drainage or signs of infection the area is preulcerative.  No other lesions are identified this time.  Vascular: Dorsalis Pedis artery and Posterior Tibial artery pedal pulses are 2/4 bilateral with immedate capillary fill time.  There is no pain with calf compression, swelling, warmth, erythema.   Neruologic: Grossly intact via light touch bilateral.   Musculoskeletal: Digital contractures are present.  Tenderness palpation of the hyperkeratotic lesion.  No other areas of discomfort.  Gait: Unassisted, Nonantalgic.    Results  PATHOLOGY Hyperkeratotic lesion: (04/30/2023)   Assessment:   1. Corns and callosities   2. Chronic anticoagulation   3. Acquired adductovarus rotation of toe, left      Plan:  Patient was evaluated and treated and all questions answered.  Assessment and Plan Assessment & Plan Corn on the left fifth toe Painful hyperkeratotic lesion on the  lateral aspect of the left fifth toe, pre-ulcerative due to pressure from improper footwear. Previous treatments ineffective. Surgical intervention considered if conservative measures fail. Emphasized appropriate footwear. - Debride the callus on the left fifth toe any complications or bleeding x 1. - Provide padding to offload pressure. - Advise on appropriate footwear with a wide toe box and soft materials. - Consider surgical intervention if conservative measures fail.    Return if symptoms worsen or fail to improve.   Charity Conch DPM

## 2023-05-01 ENCOUNTER — Institutional Professional Consult (permissible substitution) (INDEPENDENT_AMBULATORY_CARE_PROVIDER_SITE_OTHER)

## 2023-05-01 ENCOUNTER — Ambulatory Visit (INDEPENDENT_AMBULATORY_CARE_PROVIDER_SITE_OTHER): Admitting: Audiology

## 2023-05-14 DIAGNOSIS — R600 Localized edema: Secondary | ICD-10-CM | POA: Diagnosis not present

## 2023-05-14 DIAGNOSIS — I82501 Chronic embolism and thrombosis of unspecified deep veins of right lower extremity: Secondary | ICD-10-CM | POA: Diagnosis not present

## 2023-05-14 DIAGNOSIS — M79661 Pain in right lower leg: Secondary | ICD-10-CM | POA: Diagnosis not present

## 2023-05-14 DIAGNOSIS — M7551 Bursitis of right shoulder: Secondary | ICD-10-CM | POA: Diagnosis not present

## 2023-05-19 DIAGNOSIS — H2513 Age-related nuclear cataract, bilateral: Secondary | ICD-10-CM | POA: Diagnosis not present

## 2023-05-19 DIAGNOSIS — H524 Presbyopia: Secondary | ICD-10-CM | POA: Diagnosis not present

## 2023-05-19 DIAGNOSIS — H35369 Drusen (degenerative) of macula, unspecified eye: Secondary | ICD-10-CM | POA: Diagnosis not present

## 2023-05-27 ENCOUNTER — Ambulatory Visit (INDEPENDENT_AMBULATORY_CARE_PROVIDER_SITE_OTHER)

## 2023-05-27 ENCOUNTER — Encounter: Payer: Self-pay | Admitting: Podiatry

## 2023-05-27 ENCOUNTER — Ambulatory Visit: Admitting: Podiatry

## 2023-05-27 VITALS — Ht 72.0 in | Wt 253.0 lb

## 2023-05-27 DIAGNOSIS — M205X2 Other deformities of toe(s) (acquired), left foot: Secondary | ICD-10-CM

## 2023-05-27 DIAGNOSIS — L84 Corns and callosities: Secondary | ICD-10-CM

## 2023-05-27 NOTE — Progress Notes (Signed)
  Subjective:  Patient ID: Charles King, male    DOB: [birth year],  MRN: [id number]  Chief Complaint  Patient presents with   Callouses    had corn worked on last week per pt it is worse    73 y.o. male presents with the above complaint. History confirmed with patient.  Still has been quite painful for him, has been using salicylic acid treatment  Objective:  Physical Exam: warm, good capillary refill, no trophic changes or ulcerative lesions, normal DP and PT pulses, normal sensory exam, and corn on the medial left fifth toe with adductovarus contracture, raised hyperkeratosis around this   Radiographs: Multiple views x-ray of the left foot: no fracture, dislocation, swelling or degenerative changes noted and digital contractures Assessment:   1. Corns and callosities   2. Acquired adductovarus rotation of toe, left      Plan:  Patient was evaluated and treated and all questions answered.  Radiographs reviewed with patient there is no bone spur causing this currently.  I debrided the lesion further to remove the hyperkeratosis and applied salicylic acid acid treatment today.  They will continue to use this at home, discussed surgical excision if it continues which may require removal of part or all of the nail plate considering its adjacent position.  Follow-up as needed.  No follow-ups on file.

## 2023-06-05 ENCOUNTER — Ambulatory Visit (INDEPENDENT_AMBULATORY_CARE_PROVIDER_SITE_OTHER): Admitting: Physician Assistant

## 2023-06-05 ENCOUNTER — Ambulatory Visit (INDEPENDENT_AMBULATORY_CARE_PROVIDER_SITE_OTHER): Admitting: Audiology

## 2023-06-05 VITALS — BP 142/89 | HR 54 | Ht 72.0 in | Wt 250.0 lb

## 2023-06-05 DIAGNOSIS — H918X3 Other specified hearing loss, bilateral: Secondary | ICD-10-CM | POA: Diagnosis not present

## 2023-06-05 DIAGNOSIS — H903 Sensorineural hearing loss, bilateral: Secondary | ICD-10-CM | POA: Diagnosis not present

## 2023-06-05 NOTE — Patient Instructions (Signed)
 I have ordered an imaging study for you to complete prior to your next visit. Please call [facility] Scheduling at [phone] to schedule your imaging if you have not received a call within 24 hours. If you are unable to complete your imaging study prior to your next scheduled visit please call our office to let us know.

## 2023-06-05 NOTE — Progress Notes (Signed)
  [address] [place], [place] [address] [phone]  Audiological Evaluation    Name: Charles King     DOB:   09/01/1950      MRN:   [id number]                                                                                     Service Date: 06/05/2023     Accompanied by: unaccompanied   Patient comes today after Lorane Rocker, PA-C sent a referral for a hearing evaluation due to concerns with hearing loss.   Symptoms Yes Details  Hearing loss  [x]  Noted left side hearing changed after a loud sound exposure at work  Tinnitus  []    Ear pain/ infections/pressure  []    Balance problems  []    Noise exposure history  [x]  Occupational   Previous ear surgeries  []    Family history of hearing loss  []    Amplification  []    Other  []      Otoscopy: Right ear: Clear external ear canals and notable landmarks visualized on the tympanic membrane. Left ear:  Clear external ear canals and notable landmarks visualized on the tympanic membrane.  Tympanometry: Right ear: Type A- Normal external ear canal volume with normal middle ear pressure and tympanic membrane compliance. Left ear: Type A- Normal external ear canal volume with normal middle ear pressure and tympanic membrane compliance.  Pure tone Audiometry: Right ear- Normal hearing from [phone], then moderate to severe sensorineural hearing loss from 3000 Hz - 8000 Hz. Left ear-  Normal to severe sensorineural hearing loss from 250 Hz - 8000 Hz. Asymmetry noted from [phone] Hz, worse in the left ear.  Speech Audiometry: Right ear- Speech Reception Threshold (SRT) was obtained at 10 dBHL. Left ear-Speech Reception Threshold (SRT) was obtained at 20 dBHL.   Word Recognition Score Tested using NU-6 (MLV) Right ear: 88% was obtained at a presentation level of 80 dBHL with contralateral masking which is deemed as  good . Left ear: 88% was obtained at a presentation level of 80 dBHL with contralateral masking which  is deemed as  good .   The hearing test results were completed under headphones and results are deemed to be of good reliability. Test technique:  conventional    Recommendations: Follow up with ENT as scheduled for today., Return for a hearing evaluation if concerns with hearing changes arise or per MD recommendation. Use hearing protection when exposed to loud/damaging sounds. Consider a communication needs assessment after medical clearance for hearing aids is obtained.   Aadi Bordner MARIE LEROUX-MARTINEZ, AUD

## 2023-06-05 NOTE — Progress Notes (Signed)
 Dear Dr. Lovie Rudder, Here is my assessment for our mutual patient, Charles King. Thank you for allowing me the opportunity to care for your patient. Please do not hesitate to contact me should you have any other questions. Sincerely, Belma Boxer PA-C  Otolaryngology Clinic Note Referring provider: Dr. Lovie Rudder HPI:  Charles King is a 73 y.o. male kindly referred by Dr. Lovie Rudder   The patient is a 73 year old gentleman seen in our office for evaluation of tinnitus.  The patient notes that several months ago he was pulling a nail out of a board and made a high-pitched sound.  He notes after that he had some pain in the left ear as well a ringing type sound.  He has difficulty describing the sound, he denies any pulsatile nature of it.  He notes that generally is only when he opens his mouth.  He notes is not significantly bothersome and does not keep him up at night.  He notes no noticeable history of hearing loss but notes his wife does feel his hearing is poor.  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  He denies any pain in the ear today, he denies any dizziness, no numbness tingling or weakness or any other neurologic complaints.  He denies any head or neck trauma or head or neck surgery other than tonsillectomy.  He denies any history of recurrent ear infections as an adult or as an a kid.  No new medications.  No other complaints or concerns today.  No history of allergies.      Independent Review of Additional Tests or Records:  Audiological evaluation 06/05/2023  Bilateral sensorineural hearing loss with asymmetry right greater than left from 500 Hz through 2000 Hz     PMH/Meds/All/SocHx/FamHx/ROS:   Past Medical History:  Diagnosis Date   GERD (gastroesophageal reflux disease)    History of DVT (deep vein thrombosis) 12/2013     No past surgical history on file.  Family History   Problem Relation Age of Onset   Heart attack Brother      Social Connections: Moderately Integrated (03/18/2022)   Received from [facility]   Social Network    How would you rate your social network (family, work, friends)?: Adequate participation with social networks      Current Outpatient Medications:    acetaminophen  (TYLENOL ) 325 MG tablet, Take 650 mg by mouth every 6 (six) hours as needed., Disp: , Rfl:    apixaban (ELIQUIS) 5 MG TABS tablet, Take 5 mg by mouth 2 (two) times daily., Disp: , Rfl:    esomeprazole (NEXIUM) 20 MG capsule, Take 20 mg by mouth daily at 12 noon., Disp: , Rfl:    Physical Exam:   BP (!) 142/89   Pulse (!) 54   Ht 6' (1.829 m)   Wt 250 lb (113.4 kg)   SpO2 95%   BMI 33.91 kg/m   Pertinent Findings  CN II-XII intact Bilateral EAC clear and TM intact with well pneumatized middle ear spaces Weber 512: localizes right Rin[place] 512: AC > BC b/l  Anterior rhinoscopy: Septum midline; bilateral inferior turbinates with no hypertrophy  No lesions of oral cavity/oropharynx; dentition wnl, absent tonsils  No obviously palpable neck masses/lymphadenopathy/thyromegaly No respiratory distress or stridor   Seprately Identifiable Procedures:  None  Impression & Plans:  Charles King is a 73 y.o. male with the following   Asymmetric hearing  loss-  Patient presents with complaints of left sided tonal tinnitus.  This is not causing any significant issues for him.  This does not appear to be pulsatile in nature.  Audiological evaluation does show sensorineural hearing loss with asymmetry right greater than left.  Given the ongoing complaints and audiological evaluation I have recommended MRI IAC with and without contrast.  The patient will complete the MRI study, once the imaging is available for review I will call the patient to go over results.  I have also recommended hearing aids as I do feel this would improve his hearing.  The patient verbalized  understanding and agreement to this plan had no further questions or concerns   - f/u phone call after MR IAC completed   Thank you for allowing me the opportunity to care for your patient. Please do not hesitate to contact me should you have any other questions.  Sincerely, Belma Boxer PA-C [facility] Phone: [phone] Fax: [phone]  06/05/2023, 11:37 AM

## 2023-06-19 DIAGNOSIS — C8108 Nodular lymphocyte predominant Hodgkin lymphoma, lymph nodes of multiple sites: Secondary | ICD-10-CM | POA: Diagnosis not present

## 2023-06-19 DIAGNOSIS — D696 Thrombocytopenia, unspecified: Secondary | ICD-10-CM | POA: Diagnosis not present

## 2023-06-19 DIAGNOSIS — I82461 Acute embolism and thrombosis of right calf muscular vein: Secondary | ICD-10-CM | POA: Diagnosis not present

## 2023-07-08 DIAGNOSIS — I82501 Chronic embolism and thrombosis of unspecified deep veins of right lower extremity: Secondary | ICD-10-CM | POA: Diagnosis not present

## 2023-07-08 DIAGNOSIS — M79604 Pain in right leg: Secondary | ICD-10-CM | POA: Diagnosis not present

## 2023-07-10 DIAGNOSIS — I872 Venous insufficiency (chronic) (peripheral): Secondary | ICD-10-CM | POA: Diagnosis not present

## 2023-07-15 DIAGNOSIS — I83891 Varicose veins of right lower extremities with other complications: Secondary | ICD-10-CM | POA: Diagnosis not present

## 2023-07-15 DIAGNOSIS — Z09 Encounter for follow-up examination after completed treatment for conditions other than malignant neoplasm: Secondary | ICD-10-CM | POA: Diagnosis not present

## 2023-07-15 DIAGNOSIS — I83811 Varicose veins of right lower extremities with pain: Secondary | ICD-10-CM | POA: Diagnosis not present

## 2023-07-17 DIAGNOSIS — Z0000 Encounter for general adult medical examination without abnormal findings: Secondary | ICD-10-CM | POA: Diagnosis not present

## 2023-07-17 DIAGNOSIS — M1711 Unilateral primary osteoarthritis, right knee: Secondary | ICD-10-CM | POA: Diagnosis not present

## 2023-07-17 DIAGNOSIS — K219 Gastro-esophageal reflux disease without esophagitis: Secondary | ICD-10-CM | POA: Diagnosis not present

## 2023-07-17 DIAGNOSIS — Z136 Encounter for screening for cardiovascular disorders: Secondary | ICD-10-CM | POA: Diagnosis not present

## 2023-07-17 DIAGNOSIS — Z1159 Encounter for screening for other viral diseases: Secondary | ICD-10-CM | POA: Diagnosis not present

## 2023-07-17 DIAGNOSIS — E782 Mixed hyperlipidemia: Secondary | ICD-10-CM | POA: Diagnosis not present

## 2023-07-17 DIAGNOSIS — R7303 Prediabetes: Secondary | ICD-10-CM | POA: Diagnosis not present

## 2023-07-17 DIAGNOSIS — Z0189 Encounter for other specified special examinations: Secondary | ICD-10-CM | POA: Diagnosis not present

## 2023-07-17 DIAGNOSIS — Z8571 Personal history of Hodgkin lymphoma: Secondary | ICD-10-CM | POA: Diagnosis not present

## 2023-07-17 DIAGNOSIS — Z86711 Personal history of pulmonary embolism: Secondary | ICD-10-CM | POA: Diagnosis not present

## 2023-08-05 DIAGNOSIS — I83891 Varicose veins of right lower extremities with other complications: Secondary | ICD-10-CM | POA: Diagnosis not present

## 2023-08-13 DIAGNOSIS — M7551 Bursitis of right shoulder: Secondary | ICD-10-CM | POA: Diagnosis not present

## 2023-08-14 DIAGNOSIS — Z136 Encounter for screening for cardiovascular disorders: Secondary | ICD-10-CM | POA: Diagnosis not present

## 2023-08-14 DIAGNOSIS — Z87891 Personal history of nicotine dependence: Secondary | ICD-10-CM | POA: Diagnosis not present

## 2023-09-09 DIAGNOSIS — C8108 Nodular lymphocyte predominant Hodgkin lymphoma, lymph nodes of multiple sites: Secondary | ICD-10-CM | POA: Diagnosis not present

## 2023-09-17 DIAGNOSIS — D649 Anemia, unspecified: Secondary | ICD-10-CM | POA: Diagnosis not present

## 2023-09-17 DIAGNOSIS — C8108 Nodular lymphocyte predominant Hodgkin lymphoma, lymph nodes of multiple sites: Secondary | ICD-10-CM | POA: Diagnosis not present

## 2023-09-17 DIAGNOSIS — D696 Thrombocytopenia, unspecified: Secondary | ICD-10-CM | POA: Diagnosis not present

## 2023-09-17 DIAGNOSIS — I82461 Acute embolism and thrombosis of right calf muscular vein: Secondary | ICD-10-CM | POA: Diagnosis not present

## 2023-10-10 DIAGNOSIS — K209 Esophagitis, unspecified without bleeding: Secondary | ICD-10-CM | POA: Diagnosis not present

## 2023-11-12 DIAGNOSIS — I83811 Varicose veins of right lower extremities with pain: Secondary | ICD-10-CM | POA: Diagnosis not present

## 2023-11-12 DIAGNOSIS — I872 Venous insufficiency (chronic) (peripheral): Secondary | ICD-10-CM | POA: Diagnosis not present
